# Patient Record
Sex: Female | Born: 1960
Health system: Southern US, Community
[De-identification: ages and names within clinical notes are randomized; demographics above are authoritative.]

## PROBLEM LIST (undated history)

## (undated) DIAGNOSIS — T7840XA Allergy, unspecified, initial encounter: Secondary | ICD-10-CM

## (undated) DIAGNOSIS — I10 Essential (primary) hypertension: Secondary | ICD-10-CM

## (undated) DIAGNOSIS — H269 Unspecified cataract: Secondary | ICD-10-CM

## (undated) DIAGNOSIS — E785 Hyperlipidemia, unspecified: Secondary | ICD-10-CM

## (undated) DIAGNOSIS — G43909 Migraine, unspecified, not intractable, without status migrainosus: Secondary | ICD-10-CM

## (undated) DIAGNOSIS — E119 Type 2 diabetes mellitus without complications: Secondary | ICD-10-CM

## (undated) HISTORY — PX: CHOLECYSTECTOMY: SHX55

## (undated) HISTORY — DX: Migraine, unspecified, not intractable, without status migrainosus: G43.909

## (undated) HISTORY — PX: TUBAL LIGATION: SHX77

## (undated) HISTORY — DX: Unspecified cataract: H26.9

## (undated) HISTORY — DX: Hyperlipidemia, unspecified: E78.5

## (undated) HISTORY — DX: Allergy, unspecified, initial encounter: T78.40XA

---

## 1999-06-24 ENCOUNTER — Other Ambulatory Visit: Admission: RE | Admit: 1999-06-24 | Discharge: 1999-06-24 | Payer: Self-pay | Admitting: Family Medicine

## 2000-01-16 ENCOUNTER — Other Ambulatory Visit: Admission: RE | Admit: 2000-01-16 | Discharge: 2000-01-16 | Payer: Self-pay | Admitting: Family Medicine

## 2000-10-29 ENCOUNTER — Other Ambulatory Visit: Admission: RE | Admit: 2000-10-29 | Discharge: 2000-10-29 | Payer: Self-pay | Admitting: Family Medicine

## 2003-01-06 ENCOUNTER — Other Ambulatory Visit: Admission: RE | Admit: 2003-01-06 | Discharge: 2003-01-06 | Payer: Self-pay | Admitting: Obstetrics & Gynecology

## 2003-11-28 ENCOUNTER — Ambulatory Visit (HOSPITAL_COMMUNITY): Admission: RE | Admit: 2003-11-28 | Discharge: 2003-11-28 | Payer: Self-pay | Admitting: Obstetrics & Gynecology

## 2004-01-10 ENCOUNTER — Other Ambulatory Visit: Admission: RE | Admit: 2004-01-10 | Discharge: 2004-01-10 | Payer: Self-pay | Admitting: Obstetrics & Gynecology

## 2004-10-28 ENCOUNTER — Emergency Department (HOSPITAL_COMMUNITY): Admission: EM | Admit: 2004-10-28 | Discharge: 2004-10-28 | Payer: Self-pay | Admitting: Emergency Medicine

## 2005-01-22 ENCOUNTER — Other Ambulatory Visit: Admission: RE | Admit: 2005-01-22 | Discharge: 2005-01-22 | Payer: Self-pay | Admitting: Obstetrics & Gynecology

## 2009-07-14 ENCOUNTER — Inpatient Hospital Stay (HOSPITAL_COMMUNITY): Admission: EM | Admit: 2009-07-14 | Discharge: 2009-07-18 | Payer: Self-pay | Admitting: Emergency Medicine

## 2009-07-15 ENCOUNTER — Encounter (INDEPENDENT_AMBULATORY_CARE_PROVIDER_SITE_OTHER): Payer: Self-pay | Admitting: Surgery

## 2010-12-26 LAB — CBC
HCT: 37.8 % (ref 36.0–46.0)
HCT: 41.6 % (ref 36.0–46.0)
Hemoglobin: 11.5 g/dL — ABNORMAL LOW (ref 12.0–15.0)
Hemoglobin: 13.2 g/dL (ref 12.0–15.0)
Hemoglobin: 14.3 g/dL (ref 12.0–15.0)
MCHC: 34.4 g/dL (ref 30.0–36.0)
MCHC: 34.4 g/dL (ref 30.0–36.0)
MCHC: 34.9 g/dL (ref 30.0–36.0)
Platelets: 281 10*3/uL (ref 150–400)
Platelets: 299 10*3/uL (ref 150–400)
Platelets: 332 10*3/uL (ref 150–400)
Platelets: 333 10*3/uL (ref 150–400)
RBC: 3.44 MIL/uL — ABNORMAL LOW (ref 3.87–5.11)
RBC: 3.92 MIL/uL (ref 3.87–5.11)
RDW: 12.6 % (ref 11.5–15.5)
RDW: 13 % (ref 11.5–15.5)
WBC: 13.8 10*3/uL — ABNORMAL HIGH (ref 4.0–10.5)
WBC: 9.2 10*3/uL (ref 4.0–10.5)

## 2010-12-26 LAB — COMPREHENSIVE METABOLIC PANEL
ALT: 238 U/L — ABNORMAL HIGH (ref 0–35)
AST: 175 U/L — ABNORMAL HIGH (ref 0–37)
AST: 21 U/L (ref 0–37)
Alkaline Phosphatase: 107 U/L (ref 39–117)
BUN: 5 mg/dL — ABNORMAL LOW (ref 6–23)
BUN: 5 mg/dL — ABNORMAL LOW (ref 6–23)
CO2: 27 mEq/L (ref 19–32)
CO2: 30 mEq/L (ref 19–32)
Calcium: 9.2 mg/dL (ref 8.4–10.5)
Creatinine, Ser: 0.67 mg/dL (ref 0.4–1.2)
GFR calc Af Amer: >60 mL/min (ref >60)
GFR calc non Af Amer: >60 mL/min (ref >60)
GFR calc non Af Amer: >60 mL/min (ref >60)
Glucose, Bld: 95 mg/dL (ref 70–99)
Glucose, Bld: 97 mg/dL (ref 70–99)
Potassium: 3.7 mEq/L (ref 3.5–5.1)
Total Bilirubin: 0.5 mg/dL (ref 0.3–1.2)
Total Bilirubin: 0.5 mg/dL (ref 0.3–1.2)
Total Protein: 6.6 g/dL (ref 6.0–8.3)

## 2010-12-26 LAB — URINALYSIS, ROUTINE W REFLEX MICROSCOPIC
Bilirubin Urine: NEGATIVE
Glucose, UA: NEGATIVE mg/dL
Hgb urine dipstick: NEGATIVE
Ketones, ur: NEGATIVE mg/dL

## 2010-12-26 LAB — DIFFERENTIAL
Eosinophils Absolute: 0.4 10*3/uL (ref 0.0–0.7)
Eosinophils Relative: 2 % (ref 0–5)
Lymphs Abs: 3 10*3/uL (ref 0.7–4.0)
Monocytes Absolute: 1 10*3/uL (ref 0.1–1.0)

## 2010-12-26 LAB — URINE MICROSCOPIC-ADD ON

## 2011-02-07 NOTE — Op Note (Signed)
NAMEJULYSSA, Latoya Wells                        ACCOUNT NO.:  [id number]   MEDICAL RECORD NO.:  [id number]                   PATIENT TYPE:  AMB   LOCATION:  SDC                                  FACILITY:  [facility]   PHYSICIAN:  Ilda Mori, M.D.                DATE OF BIRTH:  01-Dec-1960   DATE OF PROCEDURE:  11/28/2003  DATE OF DISCHARGE:                                 OPERATIVE REPORT   PREOPERATIVE DIAGNOSIS:  Right Bartholin gland cyst.   POSTOPERATIVE DIAGNOSIS:  Right Bartholin gland cyst.   PROCEDURE:  Marsupialization of right Bartholin gland cyst.   SURGEON:  Ilda Mori, M.D.   ANESTHESIA:  General.   ESTIMATED BLOOD LOSS:  10 mL.   FINDINGS:  There was a 4 cm noninflamed cyst at the Bartholin's duct.   INDICATIONS FOR PROCEDURE:  This is a 50 year old female who has had a known  Bartholin's duct for greater than three years.  The duct has caused no  symptoms, however, in the last several months it has grown in size and the  decision was made to marsupialize it.   DESCRIPTION OF PROCEDURE:  The patient was taken to the operating room and  general anesthesia was induced.  She was placed in the dorsal lithotomy  position  and the vulva was prepped and draped in a sterile fashion.  An  incision was made at the subcutaneous junction in the right labia majora  over the cyst.  The incision was approximately 3-4 cm in length.  The  incision was carried down to the duct which was then opened and the duct was  extended the length of the cutaneous incision.  A thick brownish discharge  from the cyst was drained and the cyst was irrigated.  The cyst wall was  then marsupialized out to the subcutaneous tissue using 3-0 Vicryl sutures.  Six sutures were placed, their location was 12 o'clock, 2 o'clock, 4  o'clock, 6 o'clock, 8 o'clock, and 10 o'clock.  At the end of the procedure,  hemostasis was noted to be present and the cyst had been completely drained.  The procedure was  then terminated and the patient left the operating room in  good condition.                                               Ilda Mori, M.D.    RK/MEDQ  D:  11/28/2003  T:  11/28/2003  Job:  [id number]

## 2011-05-22 ENCOUNTER — Other Ambulatory Visit: Payer: Self-pay | Admitting: Family Medicine

## 2011-05-22 ENCOUNTER — Ambulatory Visit
Admission: RE | Admit: 2011-05-22 | Discharge: 2011-05-22 | Disposition: A | Discharge status: Home / Self Care | Payer: BC Managed Care – PPO | Source: Ambulatory Visit | Attending: Family Medicine | Admitting: Family Medicine

## 2011-05-22 DIAGNOSIS — R609 Edema, unspecified: Secondary | ICD-10-CM

## 2011-05-22 DIAGNOSIS — R52 Pain, unspecified: Secondary | ICD-10-CM

## 2013-10-11 ENCOUNTER — Other Ambulatory Visit: Payer: Self-pay

## 2014-09-06 ENCOUNTER — Emergency Department (HOSPITAL_COMMUNITY): Payer: BC Managed Care – PPO

## 2014-09-06 ENCOUNTER — Encounter (HOSPITAL_COMMUNITY): Payer: Self-pay | Admitting: Emergency Medicine

## 2014-09-06 ENCOUNTER — Emergency Department (HOSPITAL_COMMUNITY)
Admission: EM | Admit: 2014-09-06 | Discharge: 2014-09-06 | Disposition: A | Discharge status: Home / Self Care | Payer: BC Managed Care – PPO | Attending: Emergency Medicine | Admitting: Emergency Medicine

## 2014-09-06 DIAGNOSIS — I10 Essential (primary) hypertension: Secondary | ICD-10-CM | POA: Diagnosis not present

## 2014-09-06 DIAGNOSIS — Z7982 Long term (current) use of aspirin: Secondary | ICD-10-CM | POA: Insufficient documentation

## 2014-09-06 DIAGNOSIS — R1031 Right lower quadrant pain: Secondary | ICD-10-CM

## 2014-09-06 DIAGNOSIS — Z720 Tobacco use: Secondary | ICD-10-CM | POA: Diagnosis not present

## 2014-09-06 DIAGNOSIS — Z9089 Acquired absence of other organs: Secondary | ICD-10-CM | POA: Diagnosis not present

## 2014-09-06 DIAGNOSIS — Z79899 Other long term (current) drug therapy: Secondary | ICD-10-CM | POA: Diagnosis not present

## 2014-09-06 DIAGNOSIS — E119 Type 2 diabetes mellitus without complications: Secondary | ICD-10-CM | POA: Insufficient documentation

## 2014-09-06 HISTORY — DX: Essential (primary) hypertension: I10

## 2014-09-06 HISTORY — DX: Type 2 diabetes mellitus without complications: E11.9

## 2014-09-06 LAB — URINE MICROSCOPIC-ADD ON

## 2014-09-06 LAB — COMPREHENSIVE METABOLIC PANEL
ALT: 15 U/L (ref 0–35)
AST: 16 U/L (ref 0–37)
Albumin: 3.7 g/dL (ref 3.5–5.2)
Alkaline Phosphatase: 90 U/L (ref 39–117)
Anion gap: 13 (ref 5–15)
BILIRUBIN TOTAL: 0.3 mg/dL (ref 0.3–1.2)
BUN: 19 mg/dL (ref 6–23)
CALCIUM: 9.3 mg/dL (ref 8.4–10.5)
CHLORIDE: 101 meq/L (ref 96–112)
CO2: 24 mEq/L (ref 19–32)
CREATININE: 0.71 mg/dL (ref 0.50–1.10)
Glucose, Bld: 97 mg/dL (ref 70–99)
POTASSIUM: 3.9 meq/L (ref 3.7–5.3)
SODIUM: 138 meq/L (ref 137–147)
Total Protein: 7.7 g/dL (ref 6.0–8.3)

## 2014-09-06 LAB — URINALYSIS, ROUTINE W REFLEX MICROSCOPIC
Bilirubin Urine: NEGATIVE
GLUCOSE, UA: NEGATIVE mg/dL
Hgb urine dipstick: NEGATIVE
Ketones, ur: NEGATIVE mg/dL
NITRITE: NEGATIVE
PH: 5.5 (ref 5.0–8.0)
Protein, ur: NEGATIVE mg/dL
SPECIFIC GRAVITY, URINE: 1.024 (ref 1.005–1.030)
Urobilinogen, UA: 1 mg/dL (ref 0.0–1.0)

## 2014-09-06 LAB — CBC WITH DIFFERENTIAL/PLATELET
Basophils Absolute: 0.1 10*3/uL (ref 0.0–0.1)
Basophils Relative: 0 % (ref 0–1)
EOS PCT: 4 % (ref 0–5)
Eosinophils Absolute: 0.5 10*3/uL (ref 0.0–0.7)
HEMATOCRIT: 44.9 % (ref 36.0–46.0)
Hemoglobin: 14.6 g/dL (ref 12.0–15.0)
LYMPHS PCT: 32 % (ref 12–46)
Lymphs Abs: 4.4 10*3/uL — ABNORMAL HIGH (ref 0.7–4.0)
MCH: 31.5 pg (ref 26.0–34.0)
MCHC: 32.5 g/dL (ref 30.0–36.0)
MCV: 97 fL (ref 78.0–100.0)
Monocytes Absolute: 0.9 10*3/uL (ref 0.1–1.0)
Monocytes Relative: 6 % (ref 3–12)
NEUTROS PCT: 58 % (ref 43–77)
Neutro Abs: 8.1 10*3/uL — ABNORMAL HIGH (ref 1.7–7.7)
PLATELETS: 282 10*3/uL (ref 150–400)
RBC: 4.63 MIL/uL (ref 3.87–5.11)
RDW: 13.3 % (ref 11.5–15.5)
WBC: 13.9 10*3/uL — AB (ref 4.0–10.5)

## 2014-09-06 LAB — CK: CK TOTAL: 82 U/L (ref 7–177)

## 2014-09-06 MED ORDER — HYDROCODONE-ACETAMINOPHEN 5-325 MG PO TABS: 2.0000 | ORAL_TABLET | ORAL | Status: DC | PRN

## 2014-09-06 MED ORDER — DICYCLOMINE HCL 20 MG PO TABS: 20.0000 mg | ORAL_TABLET | Freq: Two times a day (BID) | ORAL | Status: DC

## 2014-09-06 MED ORDER — DIPHENOXYLATE-ATROPINE 2.5-0.025 MG PO TABS: 2.0000 | ORAL_TABLET | Freq: Four times a day (QID) | ORAL | Status: DC | PRN

## 2014-09-06 MED ORDER — ONDANSETRON HCL 4 MG/2ML IJ SOLN
4.0000 mg | Freq: Once | INTRAMUSCULAR | Status: AC
  Administered 2014-09-06: 4 mg via INTRAVENOUS
  Filled 2014-09-06: qty 2

## 2014-09-06 MED ORDER — IOHEXOL 300 MG/ML  SOLN
100.0000 mL | Freq: Once | INTRAMUSCULAR | Status: AC | PRN
  Administered 2014-09-06: 100 mL via INTRAVENOUS

## 2014-09-06 MED ORDER — FENTANYL CITRATE 0.05 MG/ML IJ SOLN
50.0000 ug | Freq: Once | INTRAMUSCULAR | Status: AC
  Administered 2014-09-06: 50 ug via INTRAVENOUS
  Filled 2014-09-06: qty 2

## 2014-09-06 MED ORDER — ONDANSETRON HCL 4 MG PO TABS: 4.0000 mg | ORAL_TABLET | Freq: Four times a day (QID) | ORAL | Status: DC

## 2014-09-06 MED ORDER — SODIUM CHLORIDE 0.9 % IV SOLN
Freq: Once | INTRAVENOUS | Status: AC
  Administered 2014-09-06: 19:00:00 via INTRAVENOUS

## 2014-09-06 NOTE — Discharge Instructions (Signed)

## 2014-09-06 NOTE — ED Notes (Signed)
Pt alert, arrives from Dr office, sent for evaluation of abd pain, onset was this am, describes pain as dull, epigastric pain, denies N/V, denies changes in bowel or bladder, resp even unlabored, skin pwd

## 2014-09-06 NOTE — ED Provider Notes (Signed)
CSN: [id number]     Arrival date & time 09/06/14  1630 History   First MD Initiated Contact with Patient 09/06/14 1805     Chief Complaint  Patient presents with  . Abdominal Pain     (Consider location/radiation/quality/duration/timing/severity/associated sxs/prior Treatment) HPI Comments: Patient sent to the emergency department for evaluation of abdominal pain. Patient reports that she first noticed pain at around 5 this morning. Reports no diffuse pain through the course of the day. No nausea, vomiting, diarrhea or fever. Pain worsened through the course of the day and she saw her doctor this evening. He became concerned about tenderness in the right lower quadrant and centered to the ER for further evaluation.  Patient also reports that she did not feel well last night. She started to notice aching pain in her arms followed by her legs and then broke out in a rash on her arms. Her doctor told her it might be the lisinopril that she takes.  Patient is a 53 y.o. female presenting with abdominal pain.  Abdominal Pain   Past Medical History  Diagnosis Date  . Diabetes mellitus without complication   . Hypertension    Past Surgical History  Procedure Laterality Date  . Cholecystectomy     No family history on file. History  Substance Use Topics  . Smoking status: Current Every Day Smoker    Types: Cigarettes  . Smokeless tobacco: Not on file  . Alcohol Use: No   OB History    No data available     Review of Systems  Gastrointestinal: Positive for abdominal pain.  All other systems reviewed and are negative.     Allergies  Hydromorphone  Home Medications   Prior to Admission medications   Medication Sig Start Date End Date Taking? Authorizing Provider  ALPRAZolam Duanne Moron) 0.5 MG tablet Take 0.25-0.5 mg by mouth at bedtime as needed for anxiety or sleep (sleep).   Yes Historical Provider, MD  aspirin 81 MG tablet Take 81 mg by mouth daily.   Yes Historical  Provider, MD  hydrochlorothiazide (HYDRODIURIL) 25 MG tablet Take 25 mg by mouth daily.   Yes Historical Provider, MD  lisinopril (PRINIVIL,ZESTRIL) 10 MG tablet Take 10 mg by mouth daily.   Yes Historical Provider, MD  metFORMIN (GLUCOPHAGE-XR) 500 MG 24 hr tablet Take 500 mg by mouth daily with breakfast.   Yes Historical Provider, MD  pravastatin (PRAVACHOL) 20 MG tablet Take 20 mg by mouth daily.   Yes Historical Provider, MD   BP 126/75 mmHg  Pulse 94  Temp(Src) 97.7 F (36.5 C) (Oral)  Resp 18  Wt 188 lb (85.276 kg)  SpO2 97% Physical Exam  Constitutional: She is oriented to person, place, and time. She appears well-developed and well-nourished. No distress.  HENT:  Head: Normocephalic and atraumatic.  Right Ear: Hearing normal.  Left Ear: Hearing normal.  Nose: Nose normal.  Mouth/Throat: Oropharynx is clear and moist and mucous membranes are normal.  Eyes: Conjunctivae and EOM are normal. Pupils are equal, round, and reactive to light.  Neck: Normal range of motion. Neck supple.  Cardiovascular: Regular rhythm, S1 normal and S2 normal.  Exam reveals no gallop and no friction rub.   No murmur heard. Pulmonary/Chest: Effort normal and breath sounds normal. No respiratory distress. She exhibits no tenderness.  Abdominal: Soft. Normal appearance and bowel sounds are normal. There is no hepatosplenomegaly. There is tenderness in the right lower quadrant. There is guarding. There is no rebound, no tenderness at  McBurney's point and negative Murphy's sign. No hernia.  Musculoskeletal: Normal range of motion.  Neurological: She is alert and oriented to person, place, and time. She has normal strength. No cranial nerve deficit or sensory deficit. Coordination normal. GCS eye subscore is 4. GCS verbal subscore is 5. GCS motor subscore is 6.  Skin: Skin is warm, dry and intact. No rash noted. No cyanosis.  Psychiatric: She has a normal mood and affect. Her speech is normal and behavior is  normal. Thought content normal.  Nursing note and vitals reviewed.   ED Course  Procedures (including critical care time) Labs Review Labs Reviewed  CBC WITH DIFFERENTIAL - Abnormal; Notable for the following:    WBC 13.9 (*)    Neutro Abs 8.1 (*)    Lymphs Abs 4.4 (*)    All other components within normal limits  URINALYSIS, ROUTINE W REFLEX MICROSCOPIC - Abnormal; Notable for the following:    Leukocytes, UA SMALL (*)    All other components within normal limits  COMPREHENSIVE METABOLIC PANEL  URINE MICROSCOPIC-ADD ON  CK  I-STAT CG4 LACTIC ACID, ED    Imaging Review No results found.   EKG Interpretation None      MDM   Final diagnoses:  RLQ abdominal pain  enteritis  She presented to the ER for evaluation of abdominal pain. She was referred by her primary care doctor to be evaluated for possibility of appendicitis. Patient reports diffuse abdominal pain through the course of the day, but she seemed to be mostly tender in the right lower quadrant upon arrival to the ER. Patient underwentET abdomen and pelvis with contrast. Appendix was identified and is normal. Findings are consistent with enteritis. Patient will be treated symptomatically, follow-up with primary doctor. She was told to return if her symptoms worsen.   Orpah Greek, MD 09/06/14 2052

## 2014-09-07 LAB — I-STAT CG4 LACTIC ACID, ED: LACTIC ACID, VENOUS: 1.64 mmol/L (ref 0.5–2.2)

## 2015-01-15 IMAGING — CT CT ABD-PELV W/ CM
1 of 3 series · 13 of 32 positions shown, 18 images · IV contrast (OMNIPAQUE 300)
Comparison: 07/14/2009

CLINICAL DATA: Acute onset of right lower quadrant pain

EXAM:
CT ABDOMEN AND PELVIS WITH CONTRAST
TECHNIQUE: Multidetector CT imaging of the abdomen and pelvis was performed
using the standard protocol following bolus administration of
intravenous contrast.
CONTRAST:  100mL OMNIPAQUE IOHEXOL 300 MG/ML  SOLN

[Series 2: abd/pel with · axial · 0.71mm/px · z∈[+910,+1335]mm · 13 of 96 slices shown, 18 images]
[im 6/96  soft-tissue]
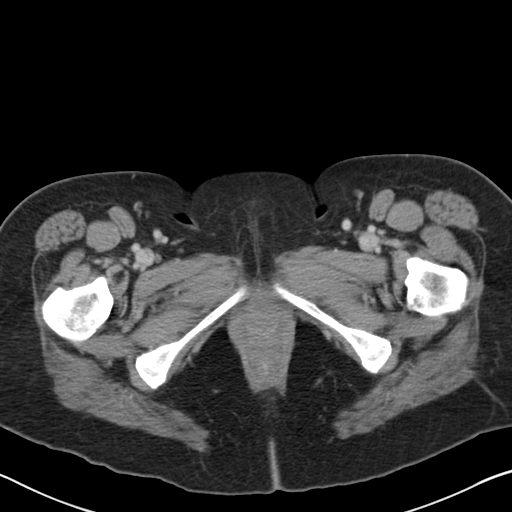
[im 6/96  bone]
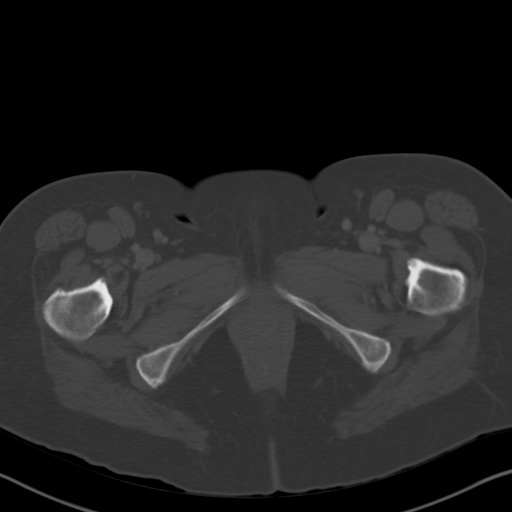
[im 16/96  soft-tissue]
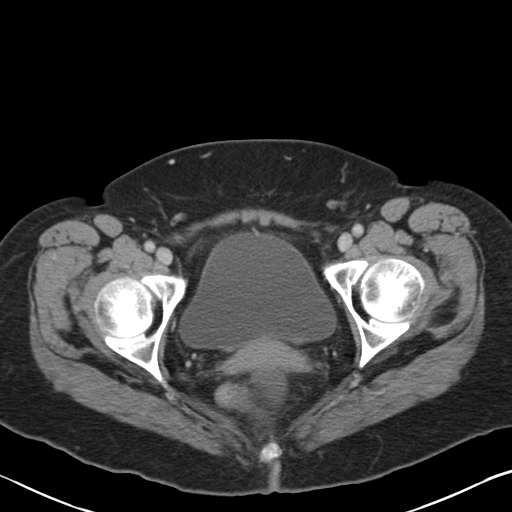
[im 21/96  soft-tissue]
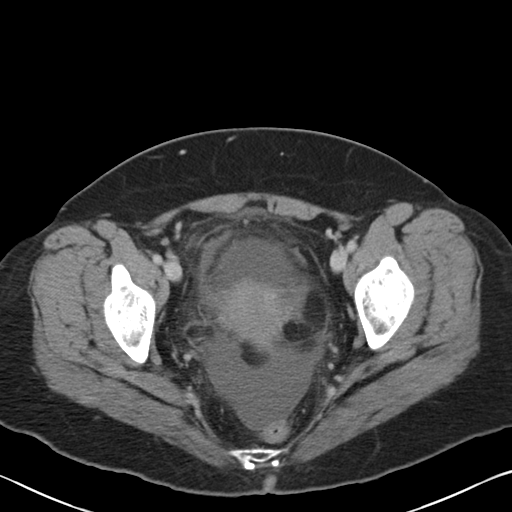
[im 31/96  soft-tissue]
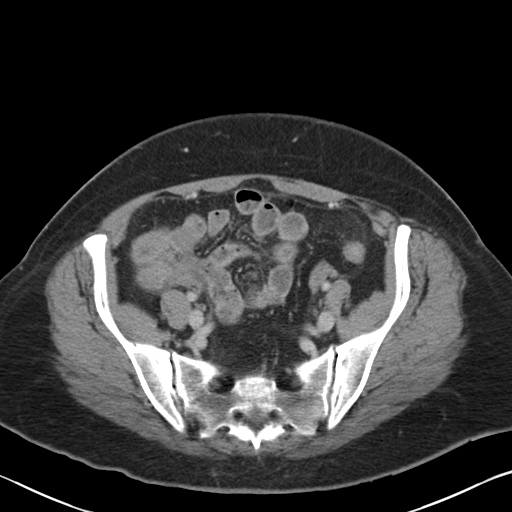
[im 36/96  soft-tissue]
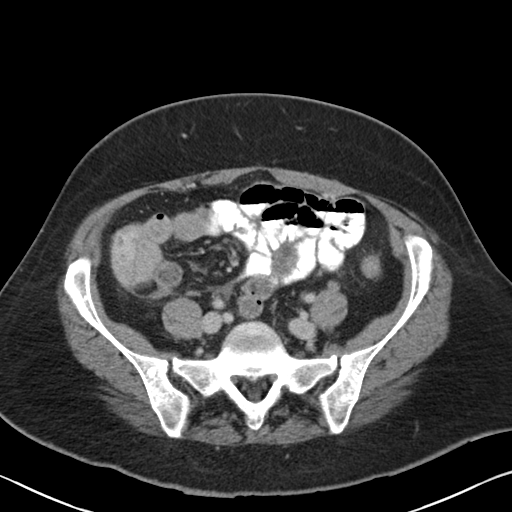
[im 46/96  soft-tissue]
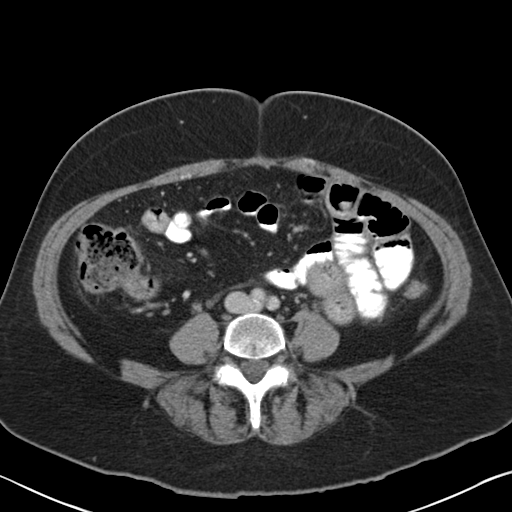
[im 51/96  soft-tissue]
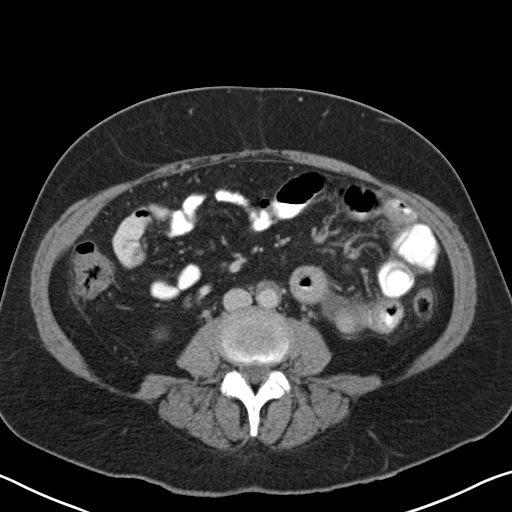
[im 61/96  soft-tissue]
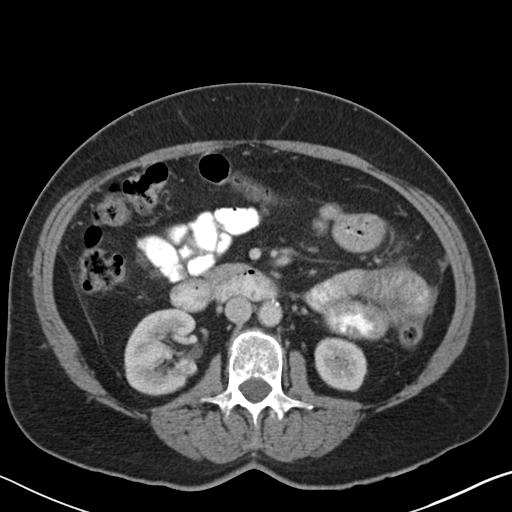
[im 66/96  soft-tissue]
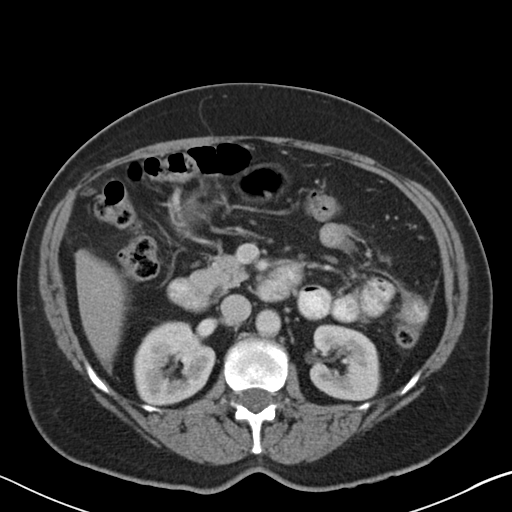
[im 66/96  bone]
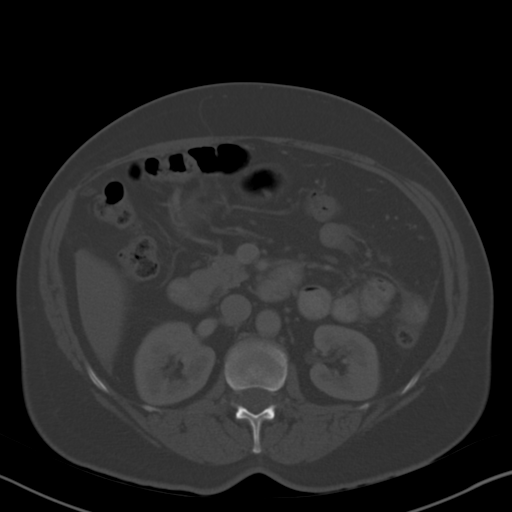
[im 76/96  soft-tissue]
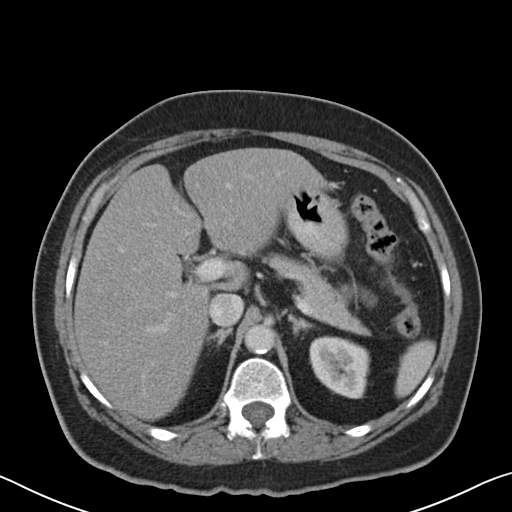
[im 76/96  lung]
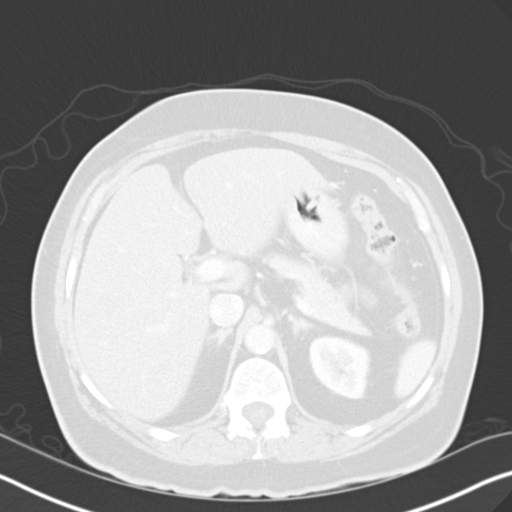
[im 81/96  soft-tissue]
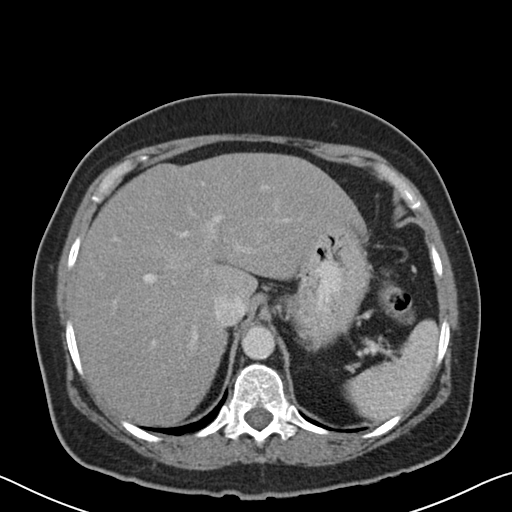
[im 81/96  lung]
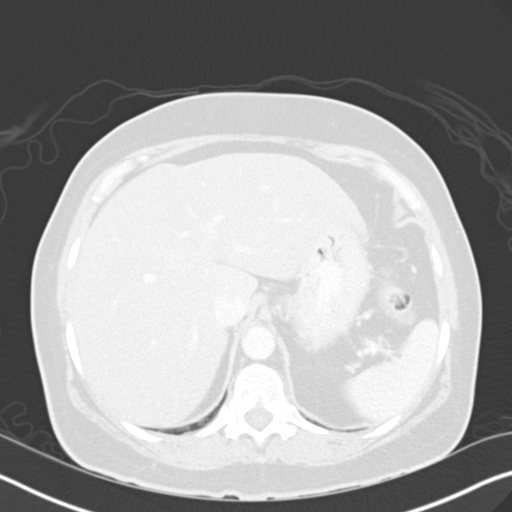
[im 86/96  lung]
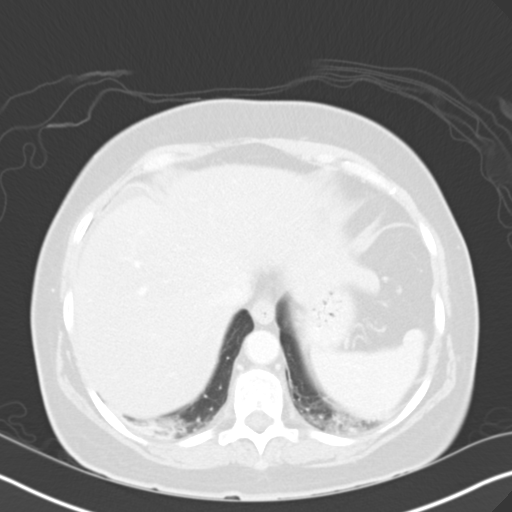
[im 91/96  soft-tissue]
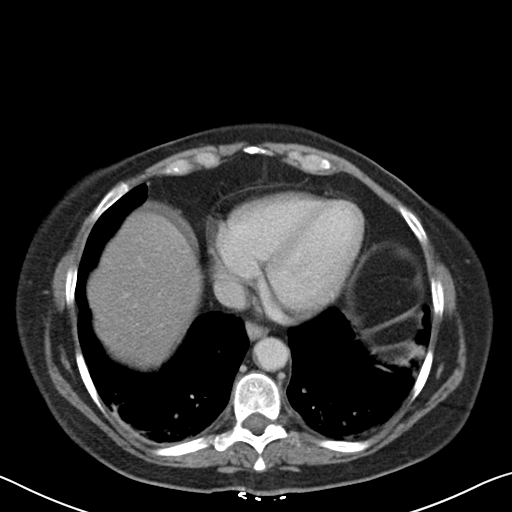
[im 91/96  lung]
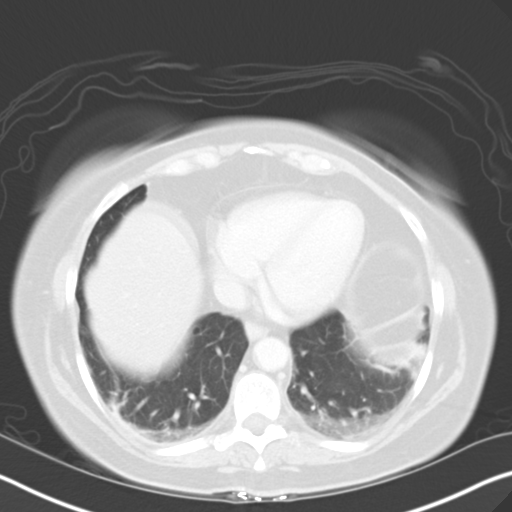

[13 of 32 positions shown; findings below may reference images not displayed]

FINDINGS: Sagittal images of the spine are unremarkable. Lung bases are
unremarkable. Tiny hiatal hernia.

There is mild hepatic fatty infiltration. The patient is status
postcholecystectomy. No intrahepatic biliary ductal dilatation. CBD
measures 8.5 mm in diameter.

Pancreas, spleen and adrenal glands are unremarkable. Kidneys are
symmetrical in size and enhancement. No hydronephrosis or
hydroureter.

Delayed renal images shows bilateral renal symmetrical excretion.
Bilateral visualized proximal ureter is unremarkable.
Atherosclerotic calcifications of abdominal aorta and iliac arteries
are noted. No aortic aneurysm.

There is no pericecal inflammation. Normal appendix clearly
visualize in axial image 59. The terminal ileum is unremarkable.
Small fecal like material noted in terminal ileum probable
incompetent ileocecal valve.

In axial image 42 and 39 there is mild thickening of the wall of
small bowel loops in jejunum in left abdomen. There is small amount
of fluid adjacent to the small bowel within mesentery. Best seen in
axial image 29. This is confirmed on coronal image 62. Findings are
consistent with segmental enteritis. There is no evidence of
extraluminal contrast material. No mesenteric abscess.

Thickening of small bowel wall also noted left abdomen axial image
45.

Tiny umbilical hernia containing fat without evidence of acute
complication.

Some stool are noted in the cecum. There is no evidence of small
bowel obstruction. No free abdominal air.

There is small to moderate pelvic ascites in posterior cul-de-sac.
There is probable fibroid within uterus axial image 73 measures 1
cm. No adnexal masses noted. The urinary bladder is unremarkable.
Sigmoid colon diverticula are noted. No evidence of acute
diverticulitis. The descending colon sigmoid colon and rectum are
empty collapsed. Small nonspecific bilateral inguinal lymph nodes.
IMPRESSION: 1. Mild hepatic fatty infiltration.  Status postcholecystectomy.
2. Normal appendix.  No pericecal inflammation.
3. Abnormal mild thickening of small bowel wall in left abdomen in
jejunal loops with small amount of adjacent fluid. Findings are
consistent with segmental enteritis. No evidence of small bowel
perforation. No mesenteric abscess. No small bowel obstruction.
4. Small to moderate free fluid noted within posterior pelvis.
Sigmoid colon diverticula are noted. No evidence of acute
diverticulitis.
5. Question small uterine fibroid within uterus measures 1 cm.

## 2015-04-09 ENCOUNTER — Telehealth: Payer: Self-pay | Admitting: Internal Medicine

## 2015-04-09 NOTE — Telephone Encounter (Signed)
new patient appt-s/w patient and gave np appt for 08/18 @ 11 w/Dr. Julien Nordmann Referring Dr. Darcus Austin Dx- Leukocytosis   Information scanned

## 2015-04-09 NOTE — Telephone Encounter (Signed)
Left message for patient and gave new d/t for appt  08/18 @ 11 w/Dr. Julien Nordmann.

## 2015-05-08 ENCOUNTER — Ambulatory Visit: Payer: Self-pay | Admitting: Internal Medicine

## 2015-05-08 ENCOUNTER — Other Ambulatory Visit: Payer: Self-pay

## 2015-05-08 ENCOUNTER — Ambulatory Visit: Payer: Self-pay

## 2015-05-10 ENCOUNTER — Ambulatory Visit: Payer: Self-pay

## 2015-05-10 ENCOUNTER — Ambulatory Visit (HOSPITAL_BASED_OUTPATIENT_CLINIC_OR_DEPARTMENT_OTHER): Payer: 59 | Admitting: Internal Medicine

## 2015-05-10 ENCOUNTER — Telehealth: Payer: Self-pay | Admitting: Internal Medicine

## 2015-05-10 ENCOUNTER — Encounter: Payer: Self-pay | Admitting: Internal Medicine

## 2015-05-10 ENCOUNTER — Other Ambulatory Visit (HOSPITAL_COMMUNITY)
Admission: RE | Admit: 2015-05-10 | Discharge: 2015-05-10 | Disposition: A | Discharge status: Home / Self Care | Payer: 59 | Source: Ambulatory Visit | Attending: Internal Medicine | Admitting: Internal Medicine

## 2015-05-10 ENCOUNTER — Other Ambulatory Visit (HOSPITAL_BASED_OUTPATIENT_CLINIC_OR_DEPARTMENT_OTHER): Payer: 59

## 2015-05-10 ENCOUNTER — Other Ambulatory Visit: Payer: Self-pay | Admitting: Medical Oncology

## 2015-05-10 ENCOUNTER — Ambulatory Visit: Payer: 59

## 2015-05-10 VITALS — BP 136/66 | HR 85 | Temp 98.3°F | Resp 18 | Ht 65.5 in | Wt 182.2 lb

## 2015-05-10 DIAGNOSIS — E119 Type 2 diabetes mellitus without complications: Secondary | ICD-10-CM

## 2015-05-10 DIAGNOSIS — D72829 Elevated white blood cell count, unspecified: Secondary | ICD-10-CM

## 2015-05-10 DIAGNOSIS — I10 Essential (primary) hypertension: Secondary | ICD-10-CM

## 2015-05-10 DIAGNOSIS — E785 Hyperlipidemia, unspecified: Secondary | ICD-10-CM

## 2015-05-10 DIAGNOSIS — D72820 Lymphocytosis (symptomatic): Secondary | ICD-10-CM | POA: Diagnosis present

## 2015-05-10 LAB — CBC WITH DIFFERENTIAL/PLATELET
BASO%: 0.7 % (ref 0.0–2.0)
BASOS ABS: 0.1 10*3/uL (ref 0.0–0.1)
EOS%: 2.8 % (ref 0.0–7.0)
Eosinophils Absolute: 0.3 10*3/uL (ref 0.0–0.5)
HCT: 43.1 % (ref 34.8–46.6)
HGB: 14.8 g/dL (ref 11.6–15.9)
LYMPH#: 4.7 10*3/uL — AB (ref 0.9–3.3)
LYMPH%: 37.3 % (ref 14.0–49.7)
MCH: 32.3 pg (ref 25.1–34.0)
MCHC: 34.4 g/dL (ref 31.5–36.0)
MCV: 94.1 fL (ref 79.5–101.0)
MONO#: 0.9 10*3/uL (ref 0.1–0.9)
MONO%: 6.9 % (ref 0.0–14.0)
NEUT#: 6.6 10*3/uL — ABNORMAL HIGH (ref 1.5–6.5)
NEUT%: 52.3 % (ref 38.4–76.8)
Platelets: 283 10*3/uL (ref 145–400)
RBC: 4.59 10*6/uL (ref 3.70–5.45)
RDW: 12.8 % (ref 11.2–14.5)
WBC: 12.6 10*3/uL — ABNORMAL HIGH (ref 3.9–10.3)

## 2015-05-10 LAB — COMPREHENSIVE METABOLIC PANEL (CC13)
ALT: 20 U/L (ref 0–55)
AST: 13 U/L (ref 5–34)
Albumin: 3.7 g/dL (ref 3.5–5.0)
Alkaline Phosphatase: 93 U/L (ref 40–150)
Anion Gap: 11 mEq/L (ref 3–11)
BUN: 15.6 mg/dL (ref 7.0–26.0)
CO2: 22 meq/L (ref 22–29)
Calcium: 9.5 mg/dL (ref 8.4–10.4)
Chloride: 108 mEq/L (ref 98–109)
Creatinine: 0.7 mg/dL (ref 0.6–1.1)
EGFR: >90 mL/min/{1.73_m2} (ref >90)
GLUCOSE: 108 mg/dL (ref 70–140)
POTASSIUM: 3.6 meq/L (ref 3.5–5.1)
SODIUM: 141 meq/L (ref 136–145)
Total Bilirubin: 0.28 mg/dL (ref 0.20–1.20)
Total Protein: 7.5 g/dL (ref 6.4–8.3)

## 2015-05-10 NOTE — Progress Notes (Signed)
[place] [phone]   Fax:([phone]) [phone]  CONSULT NOTE  REFERRING PHYSICIAN: Dr. Darcus Austin  REASON FOR CONSULTATION:  54 years old white female with leukocytosis  HPI Latoya Wells is a 54 y.o. female was past medical history significant for hypertension, diabetes mellitus and dyslipidemia as well as long history of fluctuating leukocytosis. The patient was seen recently by her primary care physician Dr. Inda Merlin for routine evaluation and management of her hypertension and diabetes mellitus. CBC performed on 03/28/2015 showed elevated white blood count of 13.9 with absolute neutrophil count of 8700 and absolute lymphocyte count of 3900. The patient was referred to me today for evaluation and recommendation regarding her leukocytosis. The patient denied having any intercurrent infection specifically no upper respiratory infection or urinary tract infection. She has chronic sinus problem and she is also a current smoker. She is not on any steroid treatment or any other hormonal therapy. She was treated for teeth abscesses few years ago. Reviewing her prior echo [facility] visit the patient has elevated white blood count of 13.8 on 07/16/2009. On 09/06/2014 her white blood count was also 13.9 with absolute neutrophil count of 8100 and absolute lymphocyte count of 4400. When seen today the patient is feeling fine with no specific complaints except for pain in the left hip area secondary to arthritis. She denied having any significant weight loss or night sweats. She has no chest pain, shortness of breath but has cough in the morning with no hemoptysis. The patient denied having any nausea or vomiting and no change in her bowel movement. Family history significant for a mother with heart disease and uterine cancer, as well as questionable myeloproliferative disorder. Father had hypertension, diabetes mellitus and heart disease. The patient is single and has one son. 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 She has a history of smoking 1 pack per day for 36 years and unfortunately she continues to smoke. She also has a history of alcohol abuse in the past and no history of drug abuse. HPI  Past Medical History  Diagnosis Date  . Diabetes mellitus without complication   . Hypertension     Past Surgical History  Procedure Laterality Date  . Cholecystectomy      History reviewed. No pertinent family history.  Social History Social History  Substance Use Topics  . Smoking status: Current Every Day Smoker -- 1.00 packs/day for 15 years    Types: Cigarettes  . Smokeless tobacco: None  . Alcohol Use: No    Allergies  Allergen Reactions  . Lisinopril Rash  . Hydromorphone Rash    Current Outpatient Prescriptions  Medication Sig Dispense Refill  . aspirin 81 MG tablet Take 81 mg by mouth daily.    . hydrochlorothiazide (HYDRODIURIL) 25 MG tablet Take 25 mg by mouth daily.    . metFORMIN (GLUCOPHAGE-XR) 500 MG 24 hr tablet Take 500 mg by mouth daily with breakfast.    . pravastatin (PRAVACHOL) 20 MG tablet Take 20 mg by mouth daily.     No current facility-administered medications for this visit.    Review of Systems  Constitutional: negative Eyes: negative Ears, nose, mouth, throat, and face: negative Respiratory: positive for cough Cardiovascular: negative Gastrointestinal: negative Genitourinary:negative Integument/breast: negative Hematologic/lymphatic: negative Musculoskeletal:positive for arthralgias Neurological: negative Behavioral/Psych: negative Endocrine: negative Allergic/Immunologic: negative  Physical Exam  HFW:YOVZC, healthy, no distress, well nourished and well developed SKIN: skin color, texture, turgor are normal, no rashes or significant lesions  HEAD: Normocephalic, No masses, lesions, tenderness or abnormalities EYES: normal, PERRLA, Conjunctiva are pink and non-injected EARS: External ears normal,  Canals clear OROPHARYNX:no exudate, no erythema and lips, buccal mucosa, and tongue normal  NECK: supple, no adenopathy, no JVD LYMPH:  no palpable lymphadenopathy, no hepatosplenomegaly BREAST:not examined LUNGS: clear to auscultation , and palpation HEART: regular rate & rhythm, no murmurs and no gallops ABDOMEN:abdomen soft, non-tender, obese, normal bowel sounds and no masses or organomegaly BACK: Back symmetric, no curvature., No CVA tenderness EXTREMITIES:no joint deformities, effusion, or inflammation, no edema, no skin discoloration  NEURO: alert & oriented x 3 with fluent speech, no focal motor/sensory deficits  PERFORMANCE STATUS: ECOG 1  LABORATORY DATA: Lab Results  Component Value Date   WBC 12.6* 05/10/2015   HGB 14.8 05/10/2015   HCT 43.1 05/10/2015   MCV 94.1 05/10/2015   PLT 283 05/10/2015      Chemistry      Component Value Date/Time   NA 141 05/10/2015 1053   NA 138 09/06/2014 1729   K 3.6 05/10/2015 1053   K 3.9 09/06/2014 1729   CL 101 09/06/2014 1729   CO2 22 05/10/2015 1053   CO2 24 09/06/2014 1729   BUN 15.6 05/10/2015 1053   BUN 19 09/06/2014 1729   CREATININE 0.7 05/10/2015 1053   CREATININE 0.71 09/06/2014 1729      Component Value Date/Time   CALCIUM 9.5 05/10/2015 1053   CALCIUM 9.3 09/06/2014 1729   ALKPHOS 93 05/10/2015 1053   ALKPHOS 90 09/06/2014 1729   AST 13 05/10/2015 1053   AST 16 09/06/2014 1729   ALT 20 05/10/2015 1053   ALT 15 09/06/2014 1729   BILITOT 0.28 05/10/2015 1053   BILITOT 0.3 09/06/2014 1729       RADIOGRAPHIC STUDIES: No results found.  ASSESSMENT: This is a very pleasant 54 years old white female presented for evaluation of persistent leukocytosis that has been going on for years and most likely reactive in nature secondary to chronic sinus problem in addition to smoking history. Myeloproliferative disorder cannot be ruled out at this point.   PLAN: I had a lengthy discussion with the patient and her  mother today about her current condition. I ordered several studies to rule out myeloproliferative disorder including repeat CBC, comprehensive metabolic panel, LDH as well as molecular study for BCR/ABL. I strongly recommended for the patient to quit smoking and also I advised her to discuss with her primary care physician treatment options for the persistent sinus infection. I will see the patient back for follow-up visit in one month for reevaluation and repeat CBC. If there is any concerning finding, I may consider the patient for a bone marrow biopsy and aspirate to rule out any bone marrow abnormality. She was advised to call immediately if she has any concerning symptoms in the interval. The patient voices understanding of current disease status and treatment options and is in agreement with the current care plan.  All questions were answered. The patient knows to call the clinic with any problems, questions or concerns. We can certainly see the patient much sooner if necessary.  Thank you so much for allowing me to participate in the care of [name]. I will continue to follow up the patient with you and assist in her care.  I spent 40 minutes counseling the patient face to face. The total time spent in the appointment was 55 minutes.  Disclaimer: This note was dictated with voice recognition software. Similar  sounding words can inadvertently be transcribed and may not be corrected upon review.   MOHAMED,MOHAMED K. May 10, 2015, 12:16 PM

## 2015-05-10 NOTE — Telephone Encounter (Signed)
Gave and printed appt sched and avs for pt for Sept °

## 2015-05-10 NOTE — Telephone Encounter (Signed)
Gave adn printed appt sched and avs for pt for Sept °

## 2015-05-10 NOTE — Progress Notes (Signed)
Checked in pt for blood concern. Pt made copay of $40 via cash. Receipt given. Pt may not need my services at this time.

## 2015-05-10 NOTE — Patient Instructions (Signed)
Smoking Cessation Quitting smoking is important to your health and has many advantages. However, it is not always easy to quit since nicotine is a very addictive drug. Oftentimes, people try 3 times or more before being able to quit. This document explains the best ways for you to prepare to quit smoking. Quitting takes hard work and a lot of effort, but you can do it. ADVANTAGES OF QUITTING SMOKING  You will live longer, feel better, and live better.  Your body will feel the impact of quitting smoking almost immediately.  Within 20 minutes, blood pressure decreases. Your pulse returns to its normal level.  After 8 hours, carbon monoxide levels in the blood return to normal. Your oxygen level increases.  After 24 hours, the chance of having a heart attack starts to decrease. Your breath, hair, and body stop smelling like smoke.  After 48 hours, damaged nerve endings begin to recover. Your sense of taste and smell improve.  After 72 hours, the body is virtually free of nicotine. Your bronchial tubes relax and breathing becomes easier.  After 2 to 12 weeks, lungs can hold more air. Exercise becomes easier and circulation improves.  The risk of having a heart attack, stroke, cancer, or lung disease is greatly reduced.  After 1 year, the risk of coronary heart disease is cut in half.  After 5 years, the risk of stroke falls to the same as a nonsmoker.  After 10 years, the risk of lung cancer is cut in half and the risk of other cancers decreases significantly.  After 15 years, the risk of coronary heart disease drops, usually to the level of a nonsmoker.  If you are pregnant, quitting smoking will improve your chances of having a healthy baby.  The people you live with, especially any children, will be healthier.  You will have extra money to spend on things other than cigarettes. QUESTIONS TO THINK ABOUT BEFORE ATTEMPTING TO QUIT You may want to talk about your answers with your  health care provider.  Why do you want to quit?  If you tried to quit in the past, what helped and what did not?  What will be the most difficult situations for you after you quit? How will you plan to handle them?  Who can help you through the tough times? Your family? Friends? A health care provider?  What pleasures do you get from smoking? What ways can you still get pleasure if you quit? Here are some questions to ask your health care provider:  How can you help me to be successful at quitting?  What medicine do you think would be best for me and how should I take it?  What should I do if I need more help?  What is smoking withdrawal like? How can I get information on withdrawal? GET READY  Set a quit date.  Change your environment by getting rid of all cigarettes, ashtrays, matches, and lighters in your home, car, or work. Do not let people smoke in your home.  Review your past attempts to quit. Think about what worked and what did not. GET SUPPORT AND ENCOURAGEMENT You have a better chance of being successful if you have help. You can get support in many ways.  Tell your family, friends, and coworkers that you are going to quit and need their support. Ask them not to smoke around you.  Get individual, group, or telephone counseling and support. Programs are available at local hospitals and health centers. Call   your local health department for information about programs in your area.  Spiritual beliefs and practices may help some smokers quit.  Download a "quit meter" on your computer to keep track of quit statistics, such as how long you have gone without smoking, cigarettes not smoked, and money saved.  Get a self-help book about quitting smoking and staying off tobacco. [place] yourself from urges to smoke. Talk to someone, go for a walk, or occupy your time with a task.  Change your normal routine. Take a different route to work.  Drink tea instead of coffee. Eat breakfast in a different place.  Reduce your stress. Take a hot bath, exercise, or read a book.  Plan something enjoyable to do every day. Reward yourself for not smoking.  Explore interactive web-based programs that specialize in helping you quit. GET MEDICINE AND USE IT CORRECTLY Medicines can help you stop smoking and decrease the urge to smoke. Combining medicine with the above behavioral methods and support can greatly increase your chances of successfully quitting smoking.  Nicotine replacement therapy helps deliver nicotine to your body without the negative effects and risks of smoking. Nicotine replacement therapy includes nicotine gum, lozenges, inhalers, nasal sprays, and skin patches. Some may be available over-the-counter and others require a prescription.  Antidepressant medicine helps people abstain from smoking, but how this works is unknown. This medicine is available by prescription.  Nicotinic receptor partial agonist medicine simulates the effect of nicotine in your brain. This medicine is available by prescription. Ask your health care provider for advice about which medicines to use and how to use them based on your health history. Your health care provider will tell you what side effects to look out for if you choose to be on a medicine or therapy. Carefully read the information on the package. Do not use any other product containing nicotine while using a nicotine replacement product.  RELAPSE OR DIFFICULT SITUATIONS Most relapses occur within the first 3 months after quitting. Do not be discouraged if you start smoking again. Remember, most people try several times before finally quitting. You may have symptoms of withdrawal because your body is used to nicotine. You may crave cigarettes, be irritable, feel very hungry, cough often, get headaches, or have difficulty concentrating. The withdrawal symptoms are only temporary. They are strongest  when you first quit, but they will go away within 10-14 days. To reduce the chances of relapse, try to:  Avoid drinking alcohol. Drinking lowers your chances of successfully quitting.  Reduce the amount of caffeine you consume. Once you quit smoking, the amount of caffeine in your body increases and can give you symptoms, such as a rapid heartbeat, sweating, and anxiety.  Avoid smokers because they can make you want to smoke.  Do not let weight gain distract you. Many smokers will gain weight when they quit, usually less than 10 pounds. Eat a healthy diet and stay active. You can always lose the weight gained after you quit.  Find ways to improve your mood other than smoking. FOR MORE INFORMATION  www.smokefree.gov  Document Released: 09/02/2001 Document Revised: 01/23/2014 Document Reviewed: 12/18/2011 [facility] Patient Information 2015 [place], [place]. This information is not intended to replace advice given to you by your health care provider. Make sure you discuss any questions you have with your health care provider.   Secondhand Smoke Secondhand smoke is the smoke exhaled by smokers and the smoke given off by a burning cigarette, cigar,  or pipe. When a cigarette is smoked, about half of the smoke is inhaled and exhaled by the smoker, and the other half floats around in the air. Exposure to secondhand smoke is also called involuntary smoking or passive smoking. People can be exposed to secondhand smoke in:   Homes.  Cars.  Workplaces.  Public places (bars, restaurants, other recreation sites). Exposure to secondhand smoke is hazardous.It contains more than 250 harmful chemicals, including at least 60 that can cause cancer. These chemicals include:  Arsenic, a heavy metal toxin.  Benzene, a chemical found in gasoline.  Beryllium, a toxic metal.  Cadmium, a metal used in batteries.  Chromium, a metallic element.  Ethylene oxide, a chemical used to sterilize medical  devices.  Nickel, a metallic element.  Polonium-210, a chemical element that gives off radiation.  Vinyl chloride, a toxic substance used in the [employer] research officer, government. Nonsmoking spouses and family members of smokers have higher rates of cancer, heart disease, and serious respiratory illnesses than those not exposed to secondhand smoke.  Nicotine, a nicotine by-product called cotinine, carbon monoxide, and other evidence of secondhand smoke exposure have been found in the body fluids of nonsmokers exposed to secondhand smoke.  Living with a smoker may increase a nonsmoker's chances of developing lung cancer by 20 to 30 percent.  Secondhand smoke may increase the risk of breast cancer, nasal sinus cavity cancer, cervical cancer, bladder cancer, and nose and throat (nasopharyngeal) cancer in adults.  Secondhand smoke may increase the risk of heart disease by 25 to 30 percent. Children are especially at risk from secondhand smoke exposure. Children of smokers have higher rates of:  Pneumonia.  Asthma.  Smoking.  Tonsilitis.  School absences. Research suggests that exposure to secondhand smoke may cause leukemia, lymphoma, and brain tumors in children. Babies are three times more likely to die from sudden infant death syndrome (SIDS) if their mothers smoked during and after pregnancy. There is no safe level of exposure to secondhand smoke. Studies have shown that even low levels of exposure can be harmful. The only way to fully protect nonsmokers from secondhand smoke exposure is to completely eliminate smoking in indoor spaces. The best thing you can do for your own health and for your children's health is to stop smoking. You should stop as soon as possible. This is not easy, and you may fail several times at quitting before you get free of this addiction. Nicotine replacement therapy ( such as patches, gum, or lozenges) can help. These therapies can help you deal with the physical  symptoms of withdrawal. Attending quit-smoking support groups can help you deal with the emotional issues of quitting smoking.  Even if you are not ready to quit right now, there are some simple changes you can make to reduce the effect of your smoking on your family:  Do not smoke in your home. Smoke away from your home in an open area, preferably outside.  Ask others to not smoke in your home.  Do not smoke while holding a child or when children are near.  Do not smoke in your car.  Avoid restaurants, day care centers, and other places that allow smoking. Document Released: 10/16/2004 Document Revised: 06/02/2012 Document Reviewed: 12/23/2013 [facility] Patient Information 2015 [place], [place]. This information is not intended to replace advice given to you by your health care provider. Make sure you discuss any questions you have with your health care provider.

## 2015-05-15 LAB — FLOW CYTOMETRY

## 2015-05-25 ENCOUNTER — Telehealth: Payer: Self-pay | Admitting: Internal Medicine

## 2015-05-25 NOTE — Telephone Encounter (Signed)
lvm for pt regarding to time change of the 9.12 appt...mailed pt appt sched and letter

## 2015-06-04 ENCOUNTER — Other Ambulatory Visit (HOSPITAL_BASED_OUTPATIENT_CLINIC_OR_DEPARTMENT_OTHER): Payer: 59

## 2015-06-04 ENCOUNTER — Encounter: Payer: Self-pay | Admitting: Internal Medicine

## 2015-06-04 ENCOUNTER — Other Ambulatory Visit: Payer: 59

## 2015-06-04 ENCOUNTER — Ambulatory Visit: Payer: 59 | Admitting: Internal Medicine

## 2015-06-04 ENCOUNTER — Ambulatory Visit (HOSPITAL_BASED_OUTPATIENT_CLINIC_OR_DEPARTMENT_OTHER): Payer: 59 | Admitting: Internal Medicine

## 2015-06-04 VITALS — BP 135/79 | HR 92 | Temp 99.3°F | Resp 18 | Ht 65.5 in | Wt 185.9 lb

## 2015-06-04 DIAGNOSIS — D72829 Elevated white blood cell count, unspecified: Secondary | ICD-10-CM | POA: Diagnosis not present

## 2015-06-04 DIAGNOSIS — Z720 Tobacco use: Secondary | ICD-10-CM

## 2015-06-04 LAB — CBC WITH DIFFERENTIAL/PLATELET
BASO%: 0.8 % (ref 0.0–2.0)
Basophils Absolute: 0.1 10*3/uL (ref 0.0–0.1)
EOS%: 2.7 % (ref 0.0–7.0)
Eosinophils Absolute: 0.3 10*3/uL (ref 0.0–0.5)
HEMATOCRIT: 41.1 % (ref 34.8–46.6)
HEMOGLOBIN: 14.2 g/dL (ref 11.6–15.9)
LYMPH#: 3.1 10*3/uL (ref 0.9–3.3)
LYMPH%: 27.7 % (ref 14.0–49.7)
MCH: 32.5 pg (ref 25.1–34.0)
MCHC: 34.5 g/dL (ref 31.5–36.0)
MCV: 94.2 fL (ref 79.5–101.0)
MONO#: 0.7 10*3/uL (ref 0.1–0.9)
MONO%: 6 % (ref 0.0–14.0)
NEUT#: 7 10*3/uL — ABNORMAL HIGH (ref 1.5–6.5)
NEUT%: 62.8 % (ref 38.4–76.8)
Platelets: 280 10*3/uL (ref 145–400)
RBC: 4.36 10*6/uL (ref 3.70–5.45)
RDW: 13 % (ref 11.2–14.5)
WBC: 11.2 10*3/uL — AB (ref 3.9–10.3)

## 2015-06-04 NOTE — Progress Notes (Signed)
[facility] [phone]   [phone]  OFFICE PROGRESS NOTE  Marjorie Smolder, MD [address] [place] [address]  DIAGNOSIS: Reactive leukocytosis.  PRIOR THERAPY: None  CURRENT THERAPY: Observation  INTERVAL HISTORY: Latoya Wells 54 y.o. female returns to the clinic today for follow-up visit. The patient denied having any significant complaints today except for arthralgia on the left hip. She denied having any fatigue or weakness. She has no chest pain, shortness breath, cough or hemoptysis. She has no nausea or vomiting. She has no weight loss or night sweats. The patient had several studies for evaluation of leukocytosis including flow cytometry of the peripheral blood that showed no findings concerning for chronic lymphocytic leukemia. She also had no prior study for BCR/ABL that showed no detectable copies. She is here for evaluation and discussion of her lab results.  MEDICAL HISTORY: Past Medical History  Diagnosis Date  . Diabetes mellitus without complication   . Hypertension     ALLERGIES:  is allergic to lisinopril and hydromorphone.  MEDICATIONS:  Current Outpatient Prescriptions  Medication Sig Dispense Refill  . aspirin 81 MG tablet Take 81 mg by mouth daily.    . hydrochlorothiazide (HYDRODIURIL) 25 MG tablet Take 25 mg by mouth daily.    . metFORMIN (GLUCOPHAGE-XR) 500 MG 24 hr tablet Take 500 mg by mouth daily with breakfast.    . pravastatin (PRAVACHOL) 20 MG tablet Take 20 mg by mouth daily.     No current facility-administered medications for this visit.    SURGICAL HISTORY:  Past Surgical History  Procedure Laterality Date  . Cholecystectomy      REVIEW OF SYSTEMS:  A comprehensive review of systems was negative except for: Musculoskeletal: positive for arthralgias   PHYSICAL EXAMINATION: General appearance: alert, cooperative and no distress Head: Normocephalic, without obvious  abnormality, atraumatic Neck: no adenopathy, no JVD, supple, symmetrical, trachea midline and thyroid not enlarged, symmetric, no tenderness/mass/nodules Lymph nodes: Cervical, supraclavicular, and axillary nodes normal. Resp: clear to auscultation bilaterally Back: symmetric, no curvature. ROM normal. No CVA tenderness. Cardio: regular rate and rhythm, S1, S2 normal, no murmur, click, rub or gallop GI: soft, non-tender; bowel sounds normal; no masses,  no organomegaly Extremities: extremities normal, atraumatic, no cyanosis or edema  ECOG PERFORMANCE STATUS: 0 - Asymptomatic  Blood pressure 135/79, pulse 92, temperature 99.3 F (37.4 C), temperature source Oral, resp. rate 18, height 5' 5.5" (1.664 m), weight 185 lb 14.4 oz (84.324 kg), SpO2 98 %.  LABORATORY DATA: Lab Results  Component Value Date   WBC 11.2* 06/04/2015   HGB 14.2 06/04/2015   HCT 41.1 06/04/2015   MCV 94.2 06/04/2015   PLT 280 06/04/2015      Chemistry      Component Value Date/Time   NA 141 05/10/2015 1053   NA 138 09/06/2014 1729   K 3.6 05/10/2015 1053   K 3.9 09/06/2014 1729   CL 101 09/06/2014 1729   CO2 22 05/10/2015 1053   CO2 24 09/06/2014 1729   BUN 15.6 05/10/2015 1053   BUN 19 09/06/2014 1729   CREATININE 0.7 05/10/2015 1053   CREATININE 0.71 09/06/2014 1729      Component Value Date/Time   CALCIUM 9.5 05/10/2015 1053   CALCIUM 9.3 09/06/2014 1729   ALKPHOS 93 05/10/2015 1053   ALKPHOS 90 09/06/2014 1729   AST 13 05/10/2015 1053   AST 16 09/06/2014 1729   ALT 20 05/10/2015 1053  ALT 15 09/06/2014 1729   BILITOT 0.28 05/10/2015 1053   BILITOT 0.3 09/06/2014 1729       RADIOGRAPHIC STUDIES: No results found.  ASSESSMENT AND PLAN: This is a very pleasant 54 years old white female with persistent leukocytosis most likely reactive in nature secondary to smoking and inflammatory process. The molecular study for BCR/ABL as well as a flow cytometry of the peripheral blood showed no  significant abnormalities. Her total white blood count is better than last visit. I discussed the lab result with the patient today. I recommended for her to continue on observation with routine follow-up visit with her primary care physician. I strongly encouraged her to quit smoking and she is working on this. I will see the patient on as-needed basis at this point. She was advised to call if she has any concerning symptoms. The patient voices understanding of current disease status and treatment options and is in agreement with the current care plan.  All questions were answered. The patient knows to call the clinic with any problems, questions or concerns. We can certainly see the patient much sooner if necessary.  Disclaimer: This note was dictated with voice recognition software. Similar sounding words can inadvertently be transcribed and may not be corrected upon review.

## 2015-09-04 ENCOUNTER — Other Ambulatory Visit: Payer: Self-pay | Admitting: Obstetrics & Gynecology

## 2015-09-04 DIAGNOSIS — R928 Other abnormal and inconclusive findings on diagnostic imaging of breast: Secondary | ICD-10-CM

## 2015-09-11 ENCOUNTER — Ambulatory Visit
Admission: RE | Admit: 2015-09-11 | Discharge: 2015-09-11 | Disposition: A | Discharge status: Home / Self Care | Payer: 59 | Source: Ambulatory Visit | Attending: Obstetrics & Gynecology | Admitting: Obstetrics & Gynecology

## 2015-09-11 DIAGNOSIS — R928 Other abnormal and inconclusive findings on diagnostic imaging of breast: Secondary | ICD-10-CM

## 2017-09-30 ENCOUNTER — Telehealth (HOSPITAL_COMMUNITY): Payer: Self-pay | Admitting: Family Medicine

## 2017-09-30 ENCOUNTER — Ambulatory Visit: Payer: 59 | Admitting: Cardiology

## 2017-10-01 ENCOUNTER — Other Ambulatory Visit: Payer: Self-pay | Admitting: Family Medicine

## 2017-10-01 DIAGNOSIS — H543 Unqualified visual loss, both eyes: Secondary | ICD-10-CM

## 2017-10-05 NOTE — Telephone Encounter (Signed)
User: Cherie Dark A Date/time: 10/02/17 10:18 AM  Comment: I called Nita and spoke with her to inform her that I tried to contact the patient 3 times and have not receieved a call back.   Context:  Outcome: Completed  Phone number: [phone] Phone Type:   Comm. type: Telephone Call type: Outgoing  Contact: Nanita Relation to patient: Provider    User: Cherie Dark A Date/time: 10/02/17 10:15 AM  Comment: Called pt and lmsg for her to CB to get scheduled for an echo.  Context:  Outcome: Left Message  Phone number: [phone] Phone Type: Home Phone  Comm. type: Telephone Call type: Outgoing  Contact: Yvonna Alanis R Relation to patient: Self    User: Cherie Dark A Date/time: 10/01/17 9:48 AM  Comment: Called pt and lmsg for her to CB to sch echo.   Context:  Outcome: Left Message  Phone number: [phone] Phone Type: Home Phone  Comm. type: Telephone Call type: Outgoing  Contact: Yvonna Alanis R Relation to patient: Self    User: Cherie Dark A Date/time: 09/30/17 11:25 AM  Comment: Called pt and lmsg for her to cb to get sch for echo.   Context:  Outcome: Left Message  Phone number: [phone] Phone Type: Home Phone  Comm. type: Telephone Call type: Outgoing  Contact: Yvonna Alanis R Relation to patient: Self

## 2017-10-07 ENCOUNTER — Ambulatory Visit
Admission: RE | Admit: 2017-10-07 | Discharge: 2017-10-07 | Disposition: A | Discharge status: Home / Self Care | Payer: BLUE CROSS/BLUE SHIELD | Source: Ambulatory Visit | Attending: Family Medicine | Admitting: Family Medicine

## 2017-10-07 ENCOUNTER — Ambulatory Visit
Admission: RE | Admit: 2017-10-07 | Discharge: 2017-10-07 | Disposition: A | Discharge status: Home / Self Care | Payer: 59 | Source: Ambulatory Visit | Attending: Family Medicine | Admitting: Family Medicine

## 2017-10-07 DIAGNOSIS — H543 Unqualified visual loss, both eyes: Secondary | ICD-10-CM

## 2017-10-07 MED ORDER — GADOBENATE DIMEGLUMINE 529 MG/ML IV SOLN
18.0000 mL | Freq: Once | INTRAVENOUS | Status: AC | PRN
  Administered 2017-10-07: 18 mL via INTRAVENOUS

## 2017-11-03 ENCOUNTER — Encounter: Payer: Self-pay | Admitting: *Deleted

## 2017-11-09 ENCOUNTER — Ambulatory Visit: Payer: 59 | Admitting: Cardiology

## 2017-11-18 ENCOUNTER — Other Ambulatory Visit: Payer: Self-pay

## 2017-11-18 ENCOUNTER — Encounter: Payer: Self-pay | Admitting: Vascular Surgery

## 2017-11-18 ENCOUNTER — Ambulatory Visit: Payer: BLUE CROSS/BLUE SHIELD | Admitting: Vascular Surgery

## 2017-11-18 VITALS — BP 136/87 | HR 90 | Temp 97.8°F | Resp 16 | Ht 65.0 in | Wt 180.0 lb

## 2017-11-18 DIAGNOSIS — I6523 Occlusion and stenosis of bilateral carotid arteries: Secondary | ICD-10-CM

## 2017-11-18 DIAGNOSIS — I739 Peripheral vascular disease, unspecified: Secondary | ICD-10-CM | POA: Diagnosis not present

## 2017-11-18 NOTE — Progress Notes (Signed)
Patient name: Latoya Wells MRN: [id number] DOB: 11/12/1960 Sex: female   REASON FOR CONSULT:    Carotid disease.  The consult is requested by Dr. Darcus Austin.  HPI:   Latoya Wells is a pleasant 57 y.o. female, who developed a sudden headache on the right side that lasted only seconds at work recently.  She also does describe some sparkles in her eyes.  The symptoms only lasted 5-7 seconds.  She states that this happened once before when her potassium was low.  She describes the headache as a "brain freeze."  She denies any focal weakness or paresthesias.  She denies any expressive or receptive aphasia or amaurosis fugax.  She is on aspirin and is on a statin.  She smokes 1-1/2 packs/day of cigarettes and has been smoking for as long as she can remember.  Her workup included a carotid duplex scan which showed a moderate 50-69% right carotid stenosis.  She was sent for vascular consultation.  Risk factors for peripheral vascular disease include diabetes, hypertension, hypercholesterolemia, a family history of premature cardiovascular disease and a history of tobacco use.  She also describes pain in her left hip.  She had fallen on a stump while working in the yard and has been seen by an Secondary school teacher.  She underwent an injection which did not make this worse.  She states that the pain in her hip is worsened by ambulation and relieved with rest.  I think this could represent hip claudication.  She denies any symptoms in the right leg.  She denies any thigh or calf claudication on the left.  She denies any history of rest pain or nonhealing ulcers.  Past Medical History:  Diagnosis Date  . Diabetes mellitus without complication ([place])   . Hyperlipidemia   . Hypertension   . Migraines     History reviewed. No pertinent family history.  She states that her father had a heart attack in his 54s.  SOCIAL HISTORY: She smokes 1-1/2 packs/day of cigarettes.  She has been smoking for as  long as she remembers. Social History   Socioeconomic History  . Marital status: Divorced    Spouse name: Not on file  . Number of children: Not on file  . Years of education: Not on file  . Highest education level: Not on file  Social Needs  . Financial resource strain: Not on file  . Food insecurity - worry: Not on file  . Food insecurity - inability: Not on file  . Transportation needs - medical: Not on file  . Transportation needs - non-medical: Not on file  Occupational History  . Not on file  Tobacco Use  . Smoking status: Current Every Day Smoker    Packs/day: 1.00    Years: 15.00    Pack years: 15.00    Types: Cigarettes  . Smokeless tobacco: Current User  Substance and Sexual Activity  . Alcohol use: No  . Drug use: No  . Sexual activity: Not on file  Other Topics Concern  . Not on file  Social History Narrative  . Not on file    Allergies  Allergen Reactions  . Lisinopril Rash  . Other Other (See Comments)    Had reaction to bladder antibiotics--generic medication--name unknown to patient  . Hydromorphone Rash    Current Outpatient Medications  Medication Sig Dispense Refill  . ALPRAZolam (XANAX) 0.5 MG tablet Take 0.5 mg by mouth daily as needed for anxiety.    Marland Kitchen  aspirin 81 MG tablet Take 81 mg by mouth daily.    Marland Kitchen esomeprazole (NEXIUM) 10 MG packet Take 10 mg by mouth daily before breakfast.    . glipiZIDE (GLUCOTROL XL) 5 MG 24 hr tablet Take 5 mg by mouth daily with breakfast.    . hydrochlorothiazide (HYDRODIURIL) 25 MG tablet Take 25 mg by mouth daily.    . metFORMIN (GLUCOPHAGE-XR) 500 MG 24 hr tablet Take 500 mg by mouth daily with breakfast.    . pravastatin (PRAVACHOL) 40 MG tablet Take 40 mg by mouth daily.  1  . pravastatin (PRAVACHOL) 20 MG tablet Take 20 mg by mouth daily.     No current facility-administered medications for this visit.     REVIEW OF SYSTEMS:  [X]  denotes positive finding, [ ]  denotes negative finding Cardiac   Comments:  Chest pain or chest pressure:    Shortness of breath upon exertion:    Short of breath when lying flat:    Irregular heart rhythm:        Vascular    Pain in calf, thigh, or hip brought on by ambulation: x   Pain in feet at night that wakes you up from your sleep:     Blood clot in your veins:    Leg swelling:         Pulmonary    Oxygen at home:    Productive cough:     Wheezing:         Neurologic    Sudden weakness in arms or legs:     Sudden numbness in arms or legs:     Sudden onset of difficulty speaking or slurred speech:    Temporary loss of vision in one eye:     Problems with dizziness:         Gastrointestinal    Blood in stool:     Vomited blood:         Genitourinary    Burning when urinating:     Blood in urine:        Psychiatric    Major depression:         Hematologic    Bleeding problems:    Problems with blood clotting too easily:        Skin    Rashes or ulcers:        Constitutional    Fever or chills:     PHYSICAL EXAM:   Vitals:   11/18/17 1107 11/18/17 1112  BP: 140/80 136/87  Pulse: 90   Resp: 16   Temp: 97.8 F (36.6 C)   TempSrc: Oral   SpO2: 96%   Weight: 180 lb (81.6 kg)   Height: 5' 5"  (1.651 m)     GENERAL: The patient is a well-nourished female, in no acute distress. The vital signs are documented above. CARDIAC: There is a regular rate and rhythm.  VASCULAR: I do not detect carotid bruits. On the right side she has a palpable femoral pulse and dorsalis pedis and posterior tibial pulse. On the left side she has a markedly diminished femoral pulse.  I cannot palpate pedal pulses on the left. She has no lower extremity swelling. PULMONARY: There is good air exchange bilaterally without wheezing or rales. ABDOMEN: Soft and non-tender with normal pitched bowel sounds.  I do not palpate an abdominal aortic aneurysm. MUSCULOSKELETAL: There are no major deformities or cyanosis. NEUROLOGIC: No focal weakness or  paresthesias are detected. SKIN: There are no ulcers or rashes noted.  PSYCHIATRIC: The patient has a normal affect.  DATA:    CAROTID DUPLEX: I have reviewed the carotid duplex scan that was done by [facility] on 10/07/2017 this shows a less than 50% left carotid stenosis with a 50-69% right carotid stenosis.  Both vertebral arteries were patent with antegrade flow.  LABS: I reviewed her labs from 09/28/2017 which showed normal renal function.  Creatinine is 0.67.  GFR is 90.  Her ESR was 34 which is elevated.  MR BRAIN: I reviewed the MRI of the brain that was done on 10/07/2017.  This shows 2 tiny chronic infarcts in the right posterior circulation vascular territory.  There was no acute intracranial abnormality.  MEDICAL ISSUES:   50-69% ASYMPTOMATIC RIGHT CAROTID STENOSIS: I do not think that her headache and visual disturbance can be attributed to her right carotid stenosis.  She has no significant carotid stenosis on the left.  I explained we would not consider carotid endarterectomy unless she developed focal right hemispheric symptoms or the stenosis progressed to greater than 80%.  I have ordered a follow-up carotid duplex scan in 6 months and I will see her back at that time.  We have discussed the importance of tobacco cessation.  We have also discussed the importance of exercise and nutrition.  She is on aspirin and is on a statin.  If she continues to have headaches on the right with some visual disturbance the other consideration would be temporal arteritis.  She does have a mildly elevated sed rate.  If her symptoms progress that I would be happy to consider the right temporal artery biopsy.  PERIPHERAL VASCULAR DISEASE: She does have evidence of left iliac artery occlusive disease which might explain some of her left hip pain.  I have encouraged her again to quit smoking and to stay as active as possible.  I have ordered follow-up ABIs when she returns in 6 months for her  carotid duplex scan.  She knows to call sooner if she has problems.  Deitra Mayo [facility] [phone]

## 2017-11-25 ENCOUNTER — Other Ambulatory Visit: Payer: Self-pay | Admitting: Family Medicine

## 2017-11-25 DIAGNOSIS — Z0000 Encounter for general adult medical examination without abnormal findings: Secondary | ICD-10-CM

## 2017-11-25 DIAGNOSIS — F17200 Nicotine dependence, unspecified, uncomplicated: Secondary | ICD-10-CM

## 2017-12-09 ENCOUNTER — Other Ambulatory Visit: Payer: Self-pay

## 2017-12-09 DIAGNOSIS — I739 Peripheral vascular disease, unspecified: Secondary | ICD-10-CM

## 2017-12-09 DIAGNOSIS — I6529 Occlusion and stenosis of unspecified carotid artery: Secondary | ICD-10-CM

## 2018-01-19 ENCOUNTER — Ambulatory Visit
Admission: RE | Admit: 2018-01-19 | Discharge: 2018-01-19 | Disposition: A | Discharge status: Home / Self Care | Payer: BLUE CROSS/BLUE SHIELD | Source: Ambulatory Visit | Attending: Family Medicine | Admitting: Family Medicine

## 2018-01-19 DIAGNOSIS — F17200 Nicotine dependence, unspecified, uncomplicated: Secondary | ICD-10-CM

## 2018-01-19 DIAGNOSIS — Z0000 Encounter for general adult medical examination without abnormal findings: Secondary | ICD-10-CM

## 2018-02-10 ENCOUNTER — Other Ambulatory Visit (HOSPITAL_COMMUNITY): Payer: Self-pay | Admitting: Family Medicine

## 2018-02-10 ENCOUNTER — Telehealth (HOSPITAL_COMMUNITY): Payer: Self-pay | Admitting: Family Medicine

## 2018-02-10 DIAGNOSIS — R079 Chest pain, unspecified: Secondary | ICD-10-CM

## 2018-02-11 NOTE — Telephone Encounter (Signed)
User: Cherie Dark A Date/time: 02/10/18 10:33 AM  Comment: Called pt and lmsg for her to CB to get sch for an echo.Vassie Moment  Context:  Outcome: Left Message  Phone number: [phone] Phone Type: Home Phone  Comm. type: Telephone Call type: Outgoing  Contact: Yvonna Alanis R Relation to patient: Self

## 2018-02-16 ENCOUNTER — Other Ambulatory Visit: Payer: Self-pay

## 2018-02-16 ENCOUNTER — Ambulatory Visit (HOSPITAL_COMMUNITY): Payer: BLUE CROSS/BLUE SHIELD | Attending: Cardiology

## 2018-02-16 DIAGNOSIS — I5189 Other ill-defined heart diseases: Secondary | ICD-10-CM | POA: Insufficient documentation

## 2018-02-16 DIAGNOSIS — I348 Other nonrheumatic mitral valve disorders: Secondary | ICD-10-CM | POA: Diagnosis not present

## 2018-02-16 DIAGNOSIS — R079 Chest pain, unspecified: Secondary | ICD-10-CM

## 2018-05-19 ENCOUNTER — Ambulatory Visit: Payer: BLUE CROSS/BLUE SHIELD | Admitting: Vascular Surgery

## 2018-05-19 ENCOUNTER — Ambulatory Visit (HOSPITAL_COMMUNITY): Payer: BLUE CROSS/BLUE SHIELD

## 2019-01-25 ENCOUNTER — Other Ambulatory Visit: Payer: Self-pay | Admitting: Family Medicine

## 2019-01-25 DIAGNOSIS — F17200 Nicotine dependence, unspecified, uncomplicated: Secondary | ICD-10-CM

## 2021-07-18 ENCOUNTER — Other Ambulatory Visit: Payer: Self-pay | Admitting: Family Medicine

## 2021-07-18 ENCOUNTER — Other Ambulatory Visit (HOSPITAL_COMMUNITY)
Admission: RE | Admit: 2021-07-18 | Discharge: 2021-07-18 | Disposition: A | Discharge status: Home / Self Care | Payer: BC Managed Care – PPO | Source: Ambulatory Visit | Attending: Family Medicine | Admitting: Family Medicine

## 2021-07-18 DIAGNOSIS — Z01411 Encounter for gynecological examination (general) (routine) with abnormal findings: Secondary | ICD-10-CM | POA: Insufficient documentation

## 2021-07-19 LAB — CYTOLOGY - PAP
Comment: NEGATIVE
Diagnosis: NEGATIVE
High risk HPV: NEGATIVE

## 2021-12-11 ENCOUNTER — Encounter: Payer: Self-pay | Admitting: Family Medicine

## 2021-12-11 ENCOUNTER — Ambulatory Visit: Payer: BC Managed Care – PPO | Admitting: Family Medicine

## 2021-12-11 ENCOUNTER — Encounter: Payer: Self-pay | Admitting: *Deleted

## 2021-12-11 VITALS — BP 130/70 | HR 94 | Temp 98.1°F | Ht 65.0 in | Wt 167.4 lb

## 2021-12-11 DIAGNOSIS — E1165 Type 2 diabetes mellitus with hyperglycemia: Secondary | ICD-10-CM | POA: Diagnosis not present

## 2021-12-11 DIAGNOSIS — Z1231 Encounter for screening mammogram for malignant neoplasm of breast: Secondary | ICD-10-CM | POA: Diagnosis not present

## 2021-12-11 DIAGNOSIS — I10 Essential (primary) hypertension: Secondary | ICD-10-CM | POA: Diagnosis not present

## 2021-12-11 DIAGNOSIS — E7800 Pure hypercholesterolemia, unspecified: Secondary | ICD-10-CM

## 2021-12-11 NOTE — Patient Instructions (Signed)
Welcome to [facility]! It was a pleasure meeting you today. ? ?As discussed, Please schedule a 3 month follow up visit today. ? ?Try Losartan 1/2 tab twice daily ? ?Exercise.  Work on diet ? ?PLEASE NOTE: ? ?If you had any LAB tests please let us know if you have not heard back within a few days. You may see your results on MyChart before we have a chance to review them but we will give you a call once they are reviewed by [place]. If we ordered any REFERRALS today, please let us know if you have not heard from their office within the next week.  ?Let us know through MyChart if you are needing REFILLS, or have your pharmacy send [place] the request. You can also use MyChart to communicate with me or any office staff. ? ?Please try these tips to maintain a healthy lifestyle: ? ?Eat most of your calories during the day when you are active. Eliminate processed foods including packaged sweets (pies, cakes, cookies), reduce intake of potatoes, white bread, white pasta, and white rice. Look for whole grain options, oat flour or almond flour. ? ?Each meal should contain half fruits/vegetables, one quarter protein, and one quarter carbs (no bigger than a computer mouse). ? ?Cut down on sweet beverages. This includes juice, soda, and sweet tea. Also watch fruit intake, though this is a healthier sweet option, it still contains natural sugar! Limit to 3 servings daily. ? ?Drink at least 1 glass of water with each meal and aim for at least 8 glasses per day ? ?Exercise at least 150 minutes every week.   ?

## 2021-12-11 NOTE — Progress Notes (Signed)
? ?New Patient Office Visit ? ?Subjective:  ?Patient ID: Latoya Wells, female    DOB: 1960-11-29  Age: 61 y.o. MRN: [id number] ? ?CC:  ?Chief Complaint  ?Patient presents with  ? Establish Care  ? ? ?HPI ?[name] presents for new pt.  Doc retired and new doc changing meds. ? DM type 2 w/hyperglycemia-A1C 9.2 on 10/18/21-on Jardiance and metformin.  Ozempic made lips tingle.  Checks sugars once/month.   ?HTN-taking losartan. 145-150/70's in am.  Later in day, 117-130/70's.  Walks all day at work No  ha/dizziness/cp/palp/edema/new cough/sob.  Was on hctz but K low and was having issues.  ?HLD-doing well on pravastatin.  LDL 77 on 07/18/21 ? ?Lives w/Mom-was trying to get her here as well, but mom canc appt ?Past Medical History:  ?Diagnosis Date  ? Allergy   ? Cataract   ? Diabetes mellitus without complication ([place])   ? Hyperlipidemia   ? Hypertension   ? ? ?Past Surgical History:  ?Procedure Laterality Date  ? CHOLECYSTECTOMY    ? TUBAL LIGATION    ? ? ?Family History  ?Problem Relation Age of Onset  ? Hypertension Mother   ? Heart disease Mother   ? Arthritis Mother   ? Diabetes Sister   ? ? ?Social History  ? ?Socioeconomic History  ? Marital status: Divorced  ?  Spouse name: Not on file  ? Number of children: 1  ? Years of education: Not on file  ? Highest education level: Not on file  ?Occupational History  ? Not on file  ?Tobacco Use  ? Smoking status: Former  ?  Packs/day: 1.00  ?  Years: 15.00  ?  Pack years: 15.00  ?  Types: Cigarettes  ?  Quit date: 2019  ?  Years since quitting: 4.2  ? Smokeless tobacco: Never  ?Vaping Use  ? Vaping Use: Former  ? Substances: Nicotine  ?Substance and Sexual Activity  ? Alcohol use: No  ? Drug use: No  ?  Types: Marijuana  ? Sexual activity: Not Currently  ?Other Topics Concern  ? Not on file  ?Social History Narrative  ? 1 son.  ? 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 ?Social Determinants of Health  ? ?Financial Resource Strain: Not on file  ?Food Insecurity: Not on  file  ?Transportation Needs: Not on file  ?Physical Activity: Not on file  ?Stress: Not on file  ?Social Connections: Not on file  ?Intimate Partner Violence: Not on file  ? ? ?ROS  ?ROS: ?Gen: no fever, chills  ?Skin: no rash, itching ?ENT: no ear pain, ear drainage, nasal congestion, rhinorrhea, sinus pressure, sore throat ?Eyes: no blurry vision, double vision ?Resp: no cough, wheeze,SOB ?Breast: no breast tenderness, no nipple discharge, no breast masses ?CV: no CP, palpitations, LE edema,  ?GI: no heartburn, n/v/d/c, abd pain ?GU: no dysuria, urgency, frequency, hematuria ?MSK: some sciatica ?Neuro: no dizziness, headache, weakness, vertigo ?Psych: no depression, anxiety, insomnia, SI  ? ? ?Objective:  ? ?Today's Vitals: BP 130/70   Pulse 94   Temp 98.1 ?F (36.7 ?C) (Temporal)   Ht '5\' 5"'$  (1.651 m)   Wt 167 lb 6 oz (75.9 kg)   SpO2 98%   BMI 27.85 kg/m?  ? ?Physical Exam  ?Gen: WDWN NAD WF ?HEENT: NCAT, conjunctiva not injected, sclera nonicteric ?TM WNL B, OP moist, no exudates  ?NECK:  supple, no thyromegaly, no nodes, no carotid bruits ?CARDIAC: RRR, S1S2+, no murmur. DP 1+B ?LUNGS:  CTAB. No wheezes ?ABDOMEN:  BS+, soft, NTND, No HSM, no masses ?EXT:  no edema ?MSK: no gross abnormalities.  ?NEURO: A&O x3.  CN II-XII intact.  ?PSYCH: normal mood. Good eye contact  ? ?Assessment & Plan:  ? ?Problem List Items Addressed This Visit   ? ?  ? Cardiovascular and Mediastinum  ? Primary hypertension  ? Relevant Medications  ? losartan (COZAAR) 100 MG tablet  ?  ? Endocrine  ? Type 2 diabetes mellitus with hyperglycemia, without long-term current use of insulin ([facility]) - Primary  ? Relevant Medications  ? losartan (COZAAR) 100 MG tablet  ? JARDIANCE 10 MG TABS tablet  ?  ? Other  ? Pure hypercholesterolemia  ? Relevant Medications  ? losartan (COZAAR) 100 MG tablet  ? Type 2 dm w/hyperglycemia-spent 35mnutes discussing meds, consequences, risks, etc.  Educated pt.  She will cont jardiance and metformin and work  on THessmer  Check sugars at least weekly.  She wants trial of TLC.  F/u 3 mo.  If sugars remaining over 200, let [place] ?HTN-controlled daytime, but high in am.  Got low K on HCTZ.  Will try 1/2 bid Losartan and see if better.  ?HLD-controlled. Cont meds.  ? ?Outpatient Encounter Medications as of 12/11/2021  ?Medication Sig  ? aspirin 81 MG tablet Take 81 mg by mouth daily.  ? JARDIANCE 10 MG TABS tablet Take 10 mg by mouth daily.  ? losartan (COZAAR) 100 MG tablet Take 100 mg by mouth daily.  ? metFORMIN (GLUCOPHAGE-XR) 500 MG 24 hr tablet Take 500 mg by mouth daily. Take 2 tablet by mouth every morning and 1 tablets every evening  ? pravastatin (PRAVACHOL) 40 MG tablet Take 40 mg by mouth daily.  ? glucose blood test strip Use to test your blood sugar  ? [DISCONTINUED] ALPRAZolam (XANAX) 0.5 MG tablet Take 0.5 mg by mouth daily as needed for anxiety.  ? [DISCONTINUED] esomeprazole (NEXIUM) 10 MG packet Take 10 mg by mouth daily before breakfast.  ? [DISCONTINUED] glipiZIDE (GLUCOTROL XL) 5 MG 24 hr tablet Take 5 mg by mouth daily with breakfast.  ? [DISCONTINUED] hydrochlorothiazide (HYDRODIURIL) 25 MG tablet Take 25 mg by mouth daily.  ? [DISCONTINUED] pravastatin (PRAVACHOL) 20 MG tablet Take 20 mg by mouth daily.  ? ?No facility-administered encounter medications on file as of 12/11/2021.  ? ? ?Follow-up: No follow-ups on file.  ? ?AWellington Hampshire MD ?

## 2021-12-19 ENCOUNTER — Other Ambulatory Visit: Payer: Self-pay | Admitting: *Deleted

## 2021-12-19 ENCOUNTER — Other Ambulatory Visit: Payer: Self-pay | Admitting: Family Medicine

## 2021-12-19 ENCOUNTER — Telehealth: Payer: Self-pay

## 2021-12-19 MED ORDER — AMLODIPINE BESYLATE 5 MG PO TABS: 5.0000 mg | ORAL_TABLET | Freq: Every day | ORAL | 0 refills | Status: DC

## 2021-12-19 NOTE — Telephone Encounter (Signed)
Spoke to patient and she stated that with taking losartan 100 mg, half a pill in the morning and half a pill in the evening she is still having elevated blood pressures of 140 to 145/ 70-80, both morning and evening. Please advise. ?

## 2021-12-19 NOTE — Telephone Encounter (Signed)
Patient notified and verbalized understanding. Rx sent to the pharmacy. ?

## 2021-12-19 NOTE — Telephone Encounter (Signed)
Patient is calling in stating Dr. Cherlynn Kaiser advised her to call back if she did not see that her BP was lowering after changing her dosage of meds.  Patient states her BP has been running 140/78 to 145/81.  Patient is requesting call back in regard.

## 2022-01-16 ENCOUNTER — Other Ambulatory Visit: Payer: Self-pay | Admitting: Family Medicine

## 2022-01-20 ENCOUNTER — Telehealth: Payer: Self-pay | Admitting: Family Medicine

## 2022-01-20 NOTE — Telephone Encounter (Signed)
[facility] -  ?

## 2022-01-20 NOTE — Telephone Encounter (Signed)
[facility] chart. ?

## 2022-01-24 ENCOUNTER — Ambulatory Visit
Admission: RE | Admit: 2022-01-24 | Discharge: 2022-01-24 | Disposition: A | Discharge status: Home / Self Care | Payer: BC Managed Care – PPO | Source: Ambulatory Visit | Attending: Family Medicine | Admitting: Family Medicine

## 2022-01-24 DIAGNOSIS — Z1231 Encounter for screening mammogram for malignant neoplasm of breast: Secondary | ICD-10-CM

## 2022-02-19 ENCOUNTER — Other Ambulatory Visit: Payer: Self-pay | Admitting: *Deleted

## 2022-02-19 ENCOUNTER — Telehealth: Payer: Self-pay | Admitting: Family Medicine

## 2022-02-19 MED ORDER — LOSARTAN POTASSIUM 50 MG PO TABS: 50.0000 mg | ORAL_TABLET | Freq: Two times a day (BID) | ORAL | 1 refills | Status: DC

## 2022-02-19 NOTE — Telephone Encounter (Signed)
Patient stated that she is currently taking losartan 25 mg 2 pills in the morning and 2 pills in the evening. Okay to refill as requested? Please advise.

## 2022-02-19 NOTE — Telephone Encounter (Signed)
Pt is wanting to go back to the 25 mg and taking 2 pills twice a day. Please advise.  . Encourage patient to contact the pharmacy for refills or they can request refills through [place]:  12/11/21  NEXT APPOINTMENT DATE: 03/05/22  MEDICATION:losartan (COZAAR) 25 MG tablet  Is the patient out of medication?   PHARMACY: [id number] [address], [place] Phone:  [phone]  Fax:  [phone]      Let patient know to contact pharmacy at the end of the day to make sure medication is ready.  Please notify patient to allow 48-72 hours to process

## 2022-02-19 NOTE — Telephone Encounter (Signed)
Patient stated that she would rather take 50 mg bid. Rx sent to the pharmacy as requested.

## 2022-03-05 ENCOUNTER — Ambulatory Visit: Payer: BC Managed Care – PPO | Admitting: Family Medicine

## 2022-03-05 ENCOUNTER — Encounter: Payer: Self-pay | Admitting: Family Medicine

## 2022-03-05 VITALS — BP 135/80 | HR 87 | Temp 98.0°F | Ht 65.0 in | Wt 156.4 lb

## 2022-03-05 DIAGNOSIS — Z1159 Encounter for screening for other viral diseases: Secondary | ICD-10-CM

## 2022-03-05 DIAGNOSIS — Z8673 Personal history of transient ischemic attack (TIA), and cerebral infarction without residual deficits: Secondary | ICD-10-CM | POA: Insufficient documentation

## 2022-03-05 DIAGNOSIS — E1165 Type 2 diabetes mellitus with hyperglycemia: Secondary | ICD-10-CM | POA: Insufficient documentation

## 2022-03-05 DIAGNOSIS — I10 Essential (primary) hypertension: Secondary | ICD-10-CM

## 2022-03-05 DIAGNOSIS — E7800 Pure hypercholesterolemia, unspecified: Secondary | ICD-10-CM

## 2022-03-05 DIAGNOSIS — I6529 Occlusion and stenosis of unspecified carotid artery: Secondary | ICD-10-CM | POA: Insufficient documentation

## 2022-03-05 DIAGNOSIS — Z114 Encounter for screening for human immunodeficiency virus [HIV]: Secondary | ICD-10-CM

## 2022-03-05 DIAGNOSIS — F17200 Nicotine dependence, unspecified, uncomplicated: Secondary | ICD-10-CM | POA: Insufficient documentation

## 2022-03-05 DIAGNOSIS — F419 Anxiety disorder, unspecified: Secondary | ICD-10-CM | POA: Insufficient documentation

## 2022-03-05 LAB — HEMOGLOBIN A1C: Hgb A1c MFr Bld: 8.4 % — ABNORMAL HIGH (ref 4.6–6.5)

## 2022-03-05 LAB — LIPID PANEL
Cholesterol: 164 mg/dL (ref 0–200)
HDL: 66.6 mg/dL (ref >39.00)
LDL Cholesterol: 82 mg/dL (ref 0–99)
NonHDL: 97.63
Total CHOL/HDL Ratio: 2
Triglycerides: 78 mg/dL (ref 0.0–149.0)
VLDL: 15.6 mg/dL (ref 0.0–40.0)

## 2022-03-05 LAB — CBC
HCT: 42.1 % (ref 36.0–46.0)
Hemoglobin: 14.2 g/dL (ref 12.0–15.0)
MCHC: 33.7 g/dL (ref 30.0–36.0)
MCV: 94.9 fl (ref 78.0–100.0)
Platelets: 299 10*3/uL (ref 150.0–400.0)
RBC: 4.44 Mil/uL (ref 3.87–5.11)
RDW: 12.9 % (ref 11.5–15.5)
WBC: 7.7 10*3/uL (ref 4.0–10.5)

## 2022-03-05 LAB — COMPREHENSIVE METABOLIC PANEL
ALT: 14 U/L (ref 0–35)
AST: 12 U/L (ref 0–37)
Albumin: 4.4 g/dL (ref 3.5–5.2)
Alkaline Phosphatase: 90 U/L (ref 39–117)
BUN: 17 mg/dL (ref 6–23)
CO2: 25 mEq/L (ref 19–32)
Calcium: 10 mg/dL (ref 8.4–10.5)
Chloride: 103 mEq/L (ref 96–112)
Creatinine, Ser: 0.54 mg/dL (ref 0.40–1.20)
GFR: 98.47 mL/min (ref >60.00)
Glucose, Bld: 160 mg/dL — ABNORMAL HIGH (ref 70–99)
Potassium: 4.6 mEq/L (ref 3.5–5.1)
Sodium: 139 mEq/L (ref 135–145)
Total Bilirubin: 0.5 mg/dL (ref 0.2–1.2)
Total Protein: 7.7 g/dL (ref 6.0–8.3)

## 2022-03-05 LAB — MICROALBUMIN / CREATININE URINE RATIO
Creatinine,U: 29.4 mg/dL
Microalb Creat Ratio: 3.6 mg/g (ref 0.0–30.0)
Microalb, Ur: 1.1 mg/dL (ref 0.0–1.9)

## 2022-03-05 LAB — TSH: TSH: 1.1 u[IU]/mL (ref 0.35–5.50)

## 2022-03-05 LAB — VITAMIN B12: Vitamin B-12: 227 pg/mL (ref 211–911)

## 2022-03-05 NOTE — Progress Notes (Signed)
Subjective:     Patient ID: Latoya Wells, female    DOB: 09-02-1961, 61 y.o.   MRN: [id number]  Chief Complaint  Patient presents with   Diabetes    HPI  DM type 2-taking Jardiance and metformin 2 in am and 1 in eve and cinnamon/chromium supps  Sugars not checking  ophth 1 yr ago.  HTN-Pr is on amlodipine and cozaar.  Bp's running  140's in am but then later in day(1 hr later and in eve) 130's/80's No ha/dizziness/cp/palp/edema/cough/sob.  Walks all day long at work.   HLD-doing well on pravastatin Cscope 2 yrs per pt-polyps-repeat 3 yrs  Health Maintenance Due  Topic Date Due   HEMOGLOBIN A1C  Never done   COVID-19 Vaccine (1) Never done   FOOT EXAM  Never done   OPHTHALMOLOGY EXAM  Never done   HIV Screening  Never done   Hepatitis C Screening  Never done   COLONOSCOPY (Pts 45-46yr Insurance coverage will need to be confirmed)  Never done   Zoster Vaccines- Shingrix (1 of 2) Never done    Past Medical History:  Diagnosis Date   Allergy    Cataract    [place]    Hyperlipidemia    Hypertension     Past Surgical History:  Procedure Laterality Date   CHOLECYSTECTOMY     TUBAL LIGATION      Outpatient Medications Prior to Visit  Medication Sig Dispense Refill   amLODipine (NORVASC) 5 MG tablet TAKE 1 TABLET(5 MG) BY MOUTH DAILY 90 tablet 1   aspirin 81 MG tablet Take 81 mg by mouth daily.     glucose blood test strip Use to test your blood sugar     JARDIANCE 10 MG TABS tablet Take 10 mg by mouth daily.     losartan (COZAAR) 50 MG tablet Take 1 tablet (50 mg total) by mouth 2 (two) times daily at 10 AM and 5 PM. 90 tablet 1   metFORMIN (GLUCOPHAGE-XR) 500 MG 24 hr tablet Take 500 mg by mouth daily. Take 2 tablet by mouth every morning and 1 tablets every evening     pravastatin (PRAVACHOL) 40 MG tablet Take 40 mg by mouth daily.  1   diclofenac (VOLTAREN) 50 MG EC tablet Take 50 mg by mouth 2 (two) times daily. (Patient not  taking: Reported on 03/05/2022)     No facility-administered medications prior to visit.    Allergies  Allergen Reactions   Lisinopril Rash    Other reaction(s): rash, tingling tongue   Hydromorphone Rash    Other reaction(s): rash   Hydromorphone Hcl Rash   Other Other (See Comments)    Had reaction to bladder antibiotics--generic medication--name unknown to patient Other reaction(s): Other Had reaction to bladder antibiotics--generic medication--name unknown to patient Had reaction to bladder antibiotics--generic medication--name unknown to patient Had reaction to bladder antibiotics--generic medication--name unknown to patient    Semaglutide(0.25 Or 0.'5mg'$ -Dos)     Other reaction(s): tingling lips   ROS  ROS: Gen: no fever, chills  Skin: no rash, itching ENT: no ear pain, ear drainage, nasal congestion, rhinorrhea, sinus pressure, sore throat Eyes: no blurry vision, double vision Resp: no cough, wheeze,SOB CV: no CP, palpitations, LE edema,  GI: no heartburn, n/v/d/c, abd pain GU: no dysuria, urgency, frequency, hematuria MSK: no joint pain, myalgias, back pain Neuro: no dizziness, headache, weakness, vertigo Psych: no depression, anxiety, insomnia, SI - had to put dog down last wk.16yo  Objective:     BP 135/80 (BP Location: Right Arm, Patient Position: Sitting, Cuff Size: Large)   Pulse 87   Temp 98 F (36.7 C) (Temporal)   Ht '5\' 5"'$  (1.651 m)   Wt 156 lb 6 oz (70.9 kg)   SpO2 99%   BMI 26.02 kg/m  Wt Readings from Last 3 Encounters:  03/05/22 156 lb 6 oz (70.9 kg)  12/11/21 167 lb 6 oz (75.9 kg)  11/18/17 180 lb (81.6 kg)    Physical Exam   Gen: WDWN NAD HEENT: NCAT, conjunctiva not injected, sclera nonicteric NECK:  supple, no thyromegaly, no nodes, no carotid bruits CARDIAC: RRR, S1S2+, no murmur. DP 2+B LUNGS: CTAB. No wheezes ABDOMEN:  BS+, soft, NTND, No HSM, no masses EXT:  no edema MSK: no gross abnormalities.  NEURO: A&O x3.  CN II-XII  intact.  PSYCH: normal mood. Good eye contact  Foot exam normal     Assessment & Plan:   Problem List Items Addressed This Visit       Cardiovascular and Mediastinum   Primary hypertension     Endocrine   Type 2 diabetes mellitus with hyperglycemia, without long-term current use of insulin ([place]) - Primary     Other   Pure hypercholesterolemia   DM type 2-chronic.  Was hyperglyemic.  Working on [employer].  Cont jardiance '10mg'$  and metformin 1000 in am and 500 in pm.  Check B12, cmp,tsh,a1c,microalb/creat ratio.  Get ophth sch.  Foot exam done today.  F/u 3 mo HTN-chronic. Mostly controlled.  Still a litte elevated in am.  Cont TLC.  Cont cozaar 50 bid.  Take the amlodipine 38min pm and see if am bp's improve.  Check cbc,cmp HLD-chronic.  Controlled on pravastatin '40mg'$ .  Cont.  Check lft's and lipids.   No orders of the defined types were placed in this encounter.   AWellington Hampshire MD

## 2022-03-05 NOTE — Patient Instructions (Signed)
It was very nice to see you today!  Check sugars weekly Try taking amlodipine in the evening and see if am bp's better.   PLEASE NOTE:  If you had any lab tests please let us know if you have not heard back within a few days. You may see your results on MyChart before we have a chance to review them but we will give you a call once they are reviewed by [place]. If we ordered any referrals today, please let us know if you have not heard from their office within the next week.   Please try these tips to maintain a healthy lifestyle:  Eat most of your calories during the day when you are active. Eliminate processed foods including packaged sweets (pies, cakes, cookies), reduce intake of potatoes, white bread, white pasta, and white rice. Look for whole grain options, oat flour or almond flour.  Each meal should contain half fruits/vegetables, one quarter protein, and one quarter carbs (no bigger than a computer mouse).  Cut down on sweet beverages. This includes juice, soda, and sweet tea. Also watch fruit intake, though this is a healthier sweet option, it still contains natural sugar! Limit to 3 servings daily.  Drink at least 1 glass of water with each meal and aim for at least 8 glasses per day  Exercise at least 150 minutes every week.

## 2022-03-06 LAB — HEPATITIS C ANTIBODY
Hepatitis C Ab: NONREACTIVE
SIGNAL TO CUT-OFF: 0.18 (ref <1.00)

## 2022-03-06 LAB — HIV ANTIBODY (ROUTINE TESTING W REFLEX): HIV 1&2 Ab, 4th Generation: NONREACTIVE

## 2022-04-18 ENCOUNTER — Telehealth: Payer: Self-pay | Admitting: Family Medicine

## 2022-04-18 ENCOUNTER — Other Ambulatory Visit: Payer: Self-pay | Admitting: *Deleted

## 2022-04-18 MED ORDER — PRAVASTATIN SODIUM 40 MG PO TABS: 40.0000 mg | ORAL_TABLET | Freq: Every day | ORAL | 1 refills | Status: AC

## 2022-04-18 MED ORDER — METFORMIN HCL ER 500 MG PO TB24: 500.0000 mg | ORAL_TABLET | Freq: Every day | ORAL | 1 refills | Status: DC

## 2022-04-18 NOTE — Telephone Encounter (Signed)
..   Encourage patient to contact the pharmacy for refills or they can request refills through [place]:  Please schedule appointment if longer than 1 year  NEXT APPOINTMENT DATE: 05/2022  MEDICATION: pravastatin (PRAVACHOL) 40 MG tablet  metFORMIN (GLUCOPHAGE-XR) 500 MG 24 hr tablet   Is the patient out of medication? NO   PHARMACY:  Let patient know to contact pharmacy at the end of the day to make sure medication is ready.  Please notify patient to allow 48-72 hours to process

## 2022-04-18 NOTE — Telephone Encounter (Signed)
Rx's sent to the pharmacy. 

## 2022-04-30 ENCOUNTER — Other Ambulatory Visit: Payer: Self-pay | Admitting: Family Medicine

## 2022-05-09 ENCOUNTER — Other Ambulatory Visit: Payer: Self-pay | Admitting: Family Medicine

## 2022-05-09 MED ORDER — METFORMIN HCL ER 500 MG PO TB24: 500.0000 mg | ORAL_TABLET | Freq: Every day | ORAL | 1 refills | Status: DC

## 2022-05-09 NOTE — Telephone Encounter (Signed)
Pt states pharmacy only received a 10 day supply of Metformin. This will not last until her next visit on 06/05/22. Please advise

## 2022-05-09 NOTE — Telephone Encounter (Signed)
Called pt and apologized for mistake, pt accepted.

## 2022-05-12 ENCOUNTER — Other Ambulatory Visit: Payer: Self-pay | Admitting: Family Medicine

## 2022-06-05 ENCOUNTER — Encounter: Payer: Self-pay | Admitting: Family Medicine

## 2022-06-05 ENCOUNTER — Ambulatory Visit: Payer: BC Managed Care – PPO | Admitting: Family Medicine

## 2022-06-05 VITALS — BP 122/84 | HR 90 | Temp 97.9°F | Ht 65.0 in | Wt 153.5 lb

## 2022-06-05 DIAGNOSIS — M5432 Sciatica, left side: Secondary | ICD-10-CM | POA: Diagnosis not present

## 2022-06-05 DIAGNOSIS — E1165 Type 2 diabetes mellitus with hyperglycemia: Secondary | ICD-10-CM | POA: Diagnosis not present

## 2022-06-05 DIAGNOSIS — Z23 Encounter for immunization: Secondary | ICD-10-CM

## 2022-06-05 DIAGNOSIS — R1084 Generalized abdominal pain: Secondary | ICD-10-CM | POA: Diagnosis not present

## 2022-06-05 MED ORDER — OMEPRAZOLE 40 MG PO CPDR: 40.0000 mg | DELAYED_RELEASE_CAPSULE | Freq: Every day | ORAL | 3 refills | Status: AC

## 2022-06-05 MED ORDER — METHOCARBAMOL 500 MG PO TABS: 500.0000 mg | ORAL_TABLET | Freq: Four times a day (QID) | ORAL | 1 refills | Status: DC | PRN

## 2022-06-05 MED ORDER — DICLOFENAC SODIUM 1 % EX GEL: 4.0000 g | Freq: Four times a day (QID) | CUTANEOUS | 3 refills | Status: DC | PRN

## 2022-06-05 MED ORDER — MELOXICAM 7.5 MG PO TABS: 7.5000 mg | ORAL_TABLET | Freq: Every day | ORAL | 1 refills | Status: DC

## 2022-06-05 MED ORDER — METFORMIN HCL ER 500 MG PO TB24: 1000.0000 mg | ORAL_TABLET | Freq: Two times a day (BID) | ORAL | 1 refills | Status: AC

## 2022-06-05 NOTE — Patient Instructions (Signed)
For back-meloxicam once or twice daily.  Tylenol 3x daily.  Methocarbamol for sleep  Omeprazole in am for stomach for 1 month.

## 2022-06-05 NOTE — Progress Notes (Signed)
Subjective:     Patient ID: Latoya Wells, female    DOB: 08-27-1961, 61 y.o.   MRN: [id number]  Chief Complaint  Patient presents with   Follow-up    3 month follow-up on dm and muscle soreness Had coffee with sugar and splenda, no food    HPI  DM type 2-on Jardiance '10mg'$ , metformin '500mg'$  XR,-2 in am, 2 in pm.  pravastatin 40, losartan 50.  A1C was 8.4 in June Hurt back helping son clean house and moved couch 1 mo ago-pain lower back.  Can't sleep-taking tylenol TID. Lower back-rad down L leg to foot.  No n/t.  No b/b dysfunction.  Can't sleep d/t pain.  Not getting worse, but not better.  Stomach burns around naval and upper abd-more at night. No n/v.  Occ heartburn.  Drinks a lot of coffee  Health Maintenance Due  Topic Date Due   OPHTHALMOLOGY EXAM  Never done   COLONOSCOPY (Pts 45-21yr Insurance coverage will need to be confirmed)  Never done    Past Medical History:  Diagnosis Date   Allergy    Cataract    Di[place] mellitus without [place]    Hyperlipidemia    Hypertension     Past Surgical History:  Procedure Laterality Date   CHOLECYSTECTOMY     TUBAL LIGATION      Outpatient Medications Prior to Visit  Medication Sig Dispense Refill   amLODipine (NORVASC) 5 MG tablet TAKE 1 TABLET(5 MG) BY MOUTH DAILY 90 tablet 1   aspirin 81 MG tablet Take 81 mg by mouth daily.     diclofenac (VOLTAREN) 50 MG EC tablet Take 50 mg by mouth 2 (two) times daily.     glucose blood test strip Use to test your blood sugar     JARDIANCE 10 MG TABS tablet TAKE 1 TABLET BY MOUTH EVERY DAY 90 tablet 1   losartan (COZAAR) 50 MG tablet TAKE 1 TABLET(50 MG) BY MOUTH TWICE DAILY AT 10 AM AND AT 5 PM 180 tablet 1   metFORMIN (GLUCOPHAGE-XR) 500 MG 24 hr tablet Take 1 tablet (500 mg total) by mouth daily. Take 2 tablet by mouth every morning and 1 tablets every evening 270 tablet 1   pravastatin (PRAVACHOL) 40 MG tablet Take 1 tablet (40 mg total) by mouth daily. 30 tablet 1    No facility-administered medications prior to visit.    Allergies  Allergen Reactions   Lisinopril Rash and Anaphylaxis    Other reaction(s): rash, tingling tongue   Hydromorphone Rash    Other reaction(s): rash   Hydromorphone Hcl Rash and Other (See Comments)   Other Other (See Comments)    Had reaction to bladder antibiotics--generic medication--name unknown to patient Other reaction(s): Other Had reaction to bladder antibiotics--generic medication--name unknown to patient Had reaction to bladder antibiotics--generic medication--name unknown to patient Had reaction to bladder antibiotics--generic medication--name unknown to patient    Semaglutide(0.25 Or 0.'5mg'$ -Dos)     Other reaction(s): tingling lips   ROS neg/noncontributory except as noted HPI/below      Objective:     BP 122/84   Pulse 90   Temp 97.9 F (36.6 C) (Temporal)   Ht '5\' 5"'$  (1.651 m)   Wt 153 lb 8 oz (69.6 kg)   SpO2 98%   BMI 25.54 kg/m  Wt Readings from Last 3 Encounters:  06/05/22 153 lb 8 oz (69.6 kg)  03/05/22 156 lb 6 oz (70.9 kg)  12/11/21 167 lb 6 oz (75.9  kg)    Physical Exam   Gen: WDWN NAD HEENT: NCAT, conjunctiva not injected, sclera nonicteric NECK:  supple, no thyromegaly, no nodes, no carotid bruits CARDIAC: RRR, S1S2+, no murmur. DP 2+B LUNGS: CTAB. No wheezes ABDOMEN:  BS+, soft, tender mid epi, No HSM, no masses EXT:  no edema MSK: tender R hip bursa.   Back:  can stand on heels/toes/1 leg.  Mild  TTP. No SI tenderness. No rash.  MS 5/5 BLE.  DTR 2+ BLE.  SLR neg B.  Good ROM .  NEURO: A&O x3.  CN II-XII intact.  PSYCH: normal mood. Good eye contact     Assessment & Plan:   Problem List Items Addressed This Visit       Endocrine   Type 2 diabetes mellitus with hyperglycemia, without long-term current use of insulin ([place]) - Primary   Other Visit Diagnoses     Sciatica of left side       Generalized abdominal pain          DM type 2 w/hyperglycemia-not  controlled.  Pt working on diet some.  Doesn't want to add more meds-declines labs today.  F/u 3 mo. L sciatica-mobic 7.'5mg'$ , tylenol, robaxin '500mg'$ .   Pt will call if not improving for PT.  Mid epi pain- omeprazole '40mg'$  daily for 1 mo  No orders of the defined types were placed in this encounter.   Wellington [place], MD

## 2022-06-17 ENCOUNTER — Telehealth: Payer: Self-pay | Admitting: *Deleted

## 2022-06-17 NOTE — Telephone Encounter (Signed)
Called patient to inform her that PA for Voltaren Gel has been denied. Patient can buy OTC or do without per pcp. Left message for patient to return call.

## 2022-06-18 NOTE — Telephone Encounter (Signed)
Patient notified of message below. Patient stated that she did buy OTC, didn't like it so she started using what she had been using before. Patient stated she is not hurting as bad, but will start PT next week.

## 2022-07-14 ENCOUNTER — Encounter: Payer: Self-pay | Admitting: Family Medicine

## 2022-07-14 ENCOUNTER — Ambulatory Visit: Payer: BC Managed Care – PPO | Admitting: Family Medicine

## 2022-07-14 VITALS — BP 176/94 | HR 85 | Temp 98.7°F | Ht 65.0 in | Wt 148.4 lb

## 2022-07-14 DIAGNOSIS — R12 Heartburn: Secondary | ICD-10-CM

## 2022-07-14 DIAGNOSIS — I10 Essential (primary) hypertension: Secondary | ICD-10-CM

## 2022-07-14 DIAGNOSIS — T50905D Adverse effect of unspecified drugs, medicaments and biological substances, subsequent encounter: Secondary | ICD-10-CM | POA: Diagnosis not present

## 2022-07-14 DIAGNOSIS — E1165 Type 2 diabetes mellitus with hyperglycemia: Secondary | ICD-10-CM

## 2022-07-14 DIAGNOSIS — R1010 Upper abdominal pain, unspecified: Secondary | ICD-10-CM

## 2022-07-14 MED ORDER — TRIAMTERENE-HCTZ 37.5-25 MG PO TABS: 1.0000 | ORAL_TABLET | Freq: Every day | ORAL | 3 refills | Status: AC

## 2022-07-14 NOTE — Patient Instructions (Addendum)
Please schedule a visit with Dr. Cherlynn Kaiser in 2-3 weeks from now to recheck your blood pressure and potassium on the new blood pressure pill.   Stop the amlodipine. Continue your Losartan  I have added a diuretic to take in addition.    If you have any questions or concerns, please don't hesitate to send me a message via MyChart or call the office at [phone]. Thank you for visiting with Latoya Wells today! It's our pleasure caring for you.

## 2022-07-14 NOTE — Progress Notes (Signed)
Subjective  CC:  Chief Complaint  Patient presents with   Abdominal Pain    Pt stated that she has been having some abd pain since she has been taking the amlodipine. The pain has eased up some but it's still there.    Same day acute visit; PCP not available. New pt to me. Chart reviewed.   HPI: Latoya Wells is a 61 y.o. female who presents to the office today to address the problems listed above in the chief complaint. 61 year old female with type 2 diabetes and hypertension presents due to upper abdominal pain.  Here with her PCP several months ago with similar complaints.  Treated for presumed GERD/gastritis.  Omeprazole has helped her heartburn, however she has persistent upper abdominal burning pain.  She feels a burning type sensation but thinks it is more superficial, skin related.  She denies nausea vomiting abdominal bloating cramping or change in bowels.  The symptoms started with the onset of use of amlodipine.  She stopped amlodipine for 3 to 4 days in the symptoms resolved.  She started back yesterday and the symptoms resurface.  She has no rashes.  She takes losartan for her blood pressure.  She had been on hydrochlorothiazide by chart review but it is document that she had some hypokalemia with that. Hypertension: She reports that she was up all night due to the symptoms above and therefore her blood pressure is partly elevated because of her fatigue and pain response.  However blood pressures have been partially well controlled on losartan and amlodipine.  No chest pain.   Assessment  1. Pain of upper abdomen   2. Adverse effect of drug, subsequent encounter   3. Type 2 diabetes mellitus with hyperglycemia, without long-term current use of insulin ([place])   4. Primary hypertension   5. Heartburn      Plan  Upper abdominal pain, patient believes related to amlodipine: We will stop amlodipine and follow-up for her symptoms.  Continue omeprazole for GERD/heartburn.  She will  follow-up with her PCP if this change does not resolve her problem for further evaluation.  I did review recent lab work, normal LFTs, CBC. Hypertension, uncontrolled: Stopping amlodipine, continue losartan and add Maxide 37.5/25 daily.  Recommend recheck in 2 to 3 weeks for follow-up blood pressure and potassium recheck with PCP.  Follow up: 2 to 3 weeks with PCP 08/05/2022  No orders of the defined types were placed in this encounter.  Meds ordered this encounter  Medications   triamterene-hydrochlorothiazide (MAXZIDE-25) 37.5-25 MG tablet    Sig: Take 1 tablet by mouth daily.    Dispense:  90 tablet    Refill:  3      I reviewed the patients updated PMH, FH, and SocHx.    Patient Active Problem List   Diagnosis Date Noted   Anxiety 03/05/2022   Carotid artery stenosis 03/05/2022   Hyperglycemia due to type 2 diabetes mellitus ([place]) 03/05/2022   Personal history of transient ischemic attack (TIA), and cerebral infarction without residual deficits 03/05/2022   Tobacco dependence 03/05/2022   Type 2 diabetes mellitus with hyperglycemia, without long-term current use of insulin ([place]) 12/11/2021   Primary hypertension 12/11/2021   Pure hypercholesterolemia 12/11/2021   Leukocytosis 05/10/2015   Current Meds  Medication Sig   aspirin 81 MG tablet Take 81 mg by mouth daily.   glucose blood test strip Use to test your blood sugar   JARDIANCE 10 MG TABS tablet TAKE 1 TABLET BY MOUTH  EVERY DAY   losartan (COZAAR) 50 MG tablet TAKE 1 TABLET(50 MG) BY MOUTH TWICE DAILY AT 10 AM AND AT 5 PM   metFORMIN (GLUCOPHAGE-XR) 500 MG 24 hr tablet Take 2 tablets (1,000 mg total) by mouth 2 (two) times daily with a meal. Take 2 tablet by mouth every morning and 1 tablets every evening   omeprazole (PRILOSEC) 40 MG capsule Take 1 capsule (40 mg total) by mouth daily.   pravastatin (PRAVACHOL) 40 MG tablet Take 1 tablet (40 mg total) by mouth daily.   triamterene-hydrochlorothiazide (MAXZIDE-25)  37.5-25 MG tablet Take 1 tablet by mouth daily.    Allergies: Patient is allergic to lisinopril, hydromorphone, hydromorphone hcl, other, and semaglutide(0.25 or 0.'5mg'$ -dos). Family History: Patient family history includes Arthritis in her mother; Diabetes in her sister; Heart disease in her mother; Hypertension in her mother. Social History:  Patient  reports that she quit smoking about 4 years ago. Her smoking use included cigarettes. She has a 15.00 pack-year smoking history. She has never used smokeless tobacco. She reports that she does not drink alcohol and does not use drugs.  Review of Systems: Constitutional: Negative for fever malaise or anorexia Cardiovascular: negative for chest pain Respiratory: negative for SOB or persistent cough Gastrointestinal: negative for abdominal pain  Objective  Vitals: BP (!) 176/94   Pulse 85   Temp 98.7 F (37.1 C)   Ht '5\' 5"'$  (1.651 m)   Wt 148 lb 6.4 oz (67.3 kg)   SpO2 98%   BMI 24.70 kg/m  General: no acute distress , A&Ox3, appears tired, nontoxic HEENT: PEERL, conjunctiva normal, neck is supple Cardiovascular:  RRR without murmur or gallop.  Respiratory:  Good breath sounds bilaterally, CTAB with normal respiratory effort Gastrointestinal: soft, flat abdomen, normal active bowel sounds, no palpable masses, no hepatosplenomegaly, no appreciated hernias Skin:  Warm, no rashes  No visits with results within 1 Day(s) from this visit.  Latest known visit with results is:  Office Visit on 03/05/2022  Component Date Value Ref Range Status   WBC 03/05/2022 7.7  4.0 - 10.5 K/uL Final   RBC 03/05/2022 4.44  3.87 - 5.11 Mil/uL Final   Platelets 03/05/2022 299.0  150.0 - 400.0 K/uL Final   Hemoglobin 03/05/2022 14.2  12.0 - 15.0 g/dL Final   HCT 03/05/2022 42.1  36.0 - 46.0 % Final   MCV 03/05/2022 94.9  78.0 - 100.0 fl Final   MCHC 03/05/2022 33.7  30.0 - 36.0 g/dL Final   RDW 03/05/2022 12.9  11.5 - 15.5 % Final   Sodium 03/05/2022 139   135 - 145 mEq/L Final   Potassium 03/05/2022 4.6  3.5 - 5.1 mEq/L Final   Chloride 03/05/2022 103  96 - 112 mEq/L Final   CO2 03/05/2022 25  19 - 32 mEq/L Final   Glucose, Bld 03/05/2022 160 (H)  70 - 99 mg/dL Final   BUN 03/05/2022 17  6 - 23 mg/dL Final   Creatinine, Ser 03/05/2022 0.54  0.40 - 1.20 mg/dL Final   Total Bilirubin 03/05/2022 0.5  0.2 - 1.2 mg/dL Final   Alkaline Phosphatase 03/05/2022 90  39 - 117 U/L Final   AST 03/05/2022 12  0 - 37 U/L Final   ALT 03/05/2022 14  0 - 35 U/L Final   Total Protein 03/05/2022 7.7  6.0 - 8.3 g/dL Final   Albumin 03/05/2022 4.4  3.5 - 5.2 g/dL Final   GFR 03/05/2022 98.47  >60.00 mL/min Final   Calcium 03/05/2022  10.0  8.4 - 10.5 mg/dL Final   Hgb A1c MFr Bld 03/05/2022 8.4 (H)  4.6 - 6.5 % Final   Cholesterol 03/05/2022 164  0 - 200 mg/dL Final   Triglycerides 03/05/2022 78.0  0.0 - 149.0 mg/dL Final   HDL 03/05/2022 66.60  >39.00 mg/dL Final   VLDL 03/05/2022 15.6  0.0 - 40.0 mg/dL Final   LDL Cholesterol 03/05/2022 82  0 - 99 mg/dL Final   Total CHOL/HDL Ratio 03/05/2022 2   Final   NonHDL 03/05/2022 97.63   Final   TSH 03/05/2022 1.10  0.35 - 5.50 uIU/mL Final   Vitamin B-12 03/05/2022 227  211 - 911 pg/mL Final   Microalb, Ur 03/05/2022 1.1  0.0 - 1.9 mg/dL Final   Creatinine,U 03/05/2022 29.4  mg/dL Final   Microalb Creat Ratio 03/05/2022 3.6  0.0 - 30.0 mg/g Final   Hepatitis C Ab 03/05/2022 NON-REACTIVE  NON-REACTIVE Final   SIGNAL TO CUT-OFF 03/05/2022 0.18  <1.00 Final   HIV 1&2 Ab, 4th Generation 03/05/2022 NON-REACTIVE  NON-REACTIVE Final     Commons side effects, risks, benefits, and alternatives for medications and treatment plan prescribed today were discussed, and the patient expressed understanding of the given instructions. Patient is instructed to call or message via MyChart if he/she has any questions or concerns regarding our treatment plan. No barriers to understanding were identified. We discussed Red Flag  symptoms and signs in detail. Patient expressed understanding regarding what to do in case of urgent or emergency type symptoms.  Medication list was reconciled, printed and provided to the patient in AVS. Patient instructions and summary information was reviewed with the patient as documented in the AVS. This note was prepared with assistance of Dragon voice recognition software. Occasional wrong-word or sound-a-like substitutions may have occurred due to the inherent limitations of voice recognition software  This visit occurred during the SARS-CoV-2 public health emergency.  Safety protocols were in place, including screening questions prior to the visit, additional usage of staff PPE, and extensive cleaning of exam room while observing appropriate contact time as indicated for disinfecting solutions.

## 2022-07-15 ENCOUNTER — Emergency Department (HOSPITAL_COMMUNITY): Payer: BC Managed Care – PPO

## 2022-07-15 ENCOUNTER — Encounter (HOSPITAL_COMMUNITY): Payer: Self-pay

## 2022-07-15 ENCOUNTER — Emergency Department (HOSPITAL_COMMUNITY)
Admission: EM | Admit: 2022-07-15 | Discharge: 2022-07-16 | Disposition: A | Discharge status: Home / Self Care | Payer: BC Managed Care – PPO | Attending: Emergency Medicine | Admitting: Emergency Medicine

## 2022-07-15 ENCOUNTER — Other Ambulatory Visit: Payer: Self-pay

## 2022-07-15 DIAGNOSIS — M545 Low back pain, unspecified: Secondary | ICD-10-CM | POA: Diagnosis not present

## 2022-07-15 DIAGNOSIS — R42 Dizziness and giddiness: Secondary | ICD-10-CM | POA: Insufficient documentation

## 2022-07-15 DIAGNOSIS — Z7982 Long term (current) use of aspirin: Secondary | ICD-10-CM | POA: Insufficient documentation

## 2022-07-15 DIAGNOSIS — K7689 Other specified diseases of liver: Secondary | ICD-10-CM | POA: Insufficient documentation

## 2022-07-15 DIAGNOSIS — K8689 Other specified diseases of pancreas: Secondary | ICD-10-CM

## 2022-07-15 DIAGNOSIS — R1013 Epigastric pain: Secondary | ICD-10-CM | POA: Diagnosis present

## 2022-07-15 DIAGNOSIS — D72829 Elevated white blood cell count, unspecified: Secondary | ICD-10-CM | POA: Insufficient documentation

## 2022-07-15 DIAGNOSIS — K869 Disease of pancreas, unspecified: Secondary | ICD-10-CM | POA: Insufficient documentation

## 2022-07-15 DIAGNOSIS — R1010 Upper abdominal pain, unspecified: Secondary | ICD-10-CM

## 2022-07-15 LAB — CBC
HCT: 41.5 % (ref 36.0–46.0)
Hemoglobin: 14.3 g/dL (ref 12.0–15.0)
MCH: 32.7 pg (ref 26.0–34.0)
MCHC: 34.5 g/dL (ref 30.0–36.0)
MCV: 95 fL (ref 80.0–100.0)
Platelets: 213 10*3/uL (ref 150–400)
RBC: 4.37 MIL/uL (ref 3.87–5.11)
RDW: 12.1 % (ref 11.5–15.5)
WBC: 12.4 10*3/uL — ABNORMAL HIGH (ref 4.0–10.5)
nRBC: 0 % (ref 0.0–0.2)

## 2022-07-15 LAB — COMPREHENSIVE METABOLIC PANEL
ALT: 18 U/L (ref 0–44)
AST: 18 U/L (ref 15–41)
Albumin: 4.4 g/dL (ref 3.5–5.0)
Alkaline Phosphatase: 79 U/L (ref 38–126)
Anion gap: 12 (ref 5–15)
BUN: 20 mg/dL (ref 8–23)
CO2: 23 mmol/L (ref 22–32)
Calcium: 9.4 mg/dL (ref 8.9–10.3)
Chloride: 102 mmol/L (ref 98–111)
Creatinine, Ser: 0.73 mg/dL (ref 0.44–1.00)
GFR, Estimated: >60 mL/min (ref >60)
Glucose, Bld: 149 mg/dL — ABNORMAL HIGH (ref 70–99)
Potassium: 3.6 mmol/L (ref 3.5–5.1)
Sodium: 137 mmol/L (ref 135–145)
Total Bilirubin: 0.7 mg/dL (ref 0.3–1.2)
Total Protein: 8.2 g/dL — ABNORMAL HIGH (ref 6.5–8.1)

## 2022-07-15 LAB — LIPASE, BLOOD: Lipase: 41 U/L (ref 11–51)

## 2022-07-15 MED ORDER — IOHEXOL 300 MG/ML  SOLN
100.0000 mL | Freq: Once | INTRAMUSCULAR | Status: AC | PRN
  Administered 2022-07-15: 100 mL via INTRAVENOUS

## 2022-07-15 NOTE — ED Provider Notes (Signed)
[facility] DEPT Provider Note   CSN: [id number] Arrival date & time: 07/15/22  1659     History {Add pertinent medical, surgical, social history, OB history to HPI:1} Chief Complaint  Patient presents with   Abdominal Pain   Weakness   Back Pain    Latoya Wells is a 61 y.o. female.  He is here with multiple complaints.  She has had some ongoing low back pain for the last few months and just started seeing physical therapy.  For the last month or so she had some upper abdominal discomfort that started after she began a new blood pressure medication.  It seemed a little better this past weekend after she stopped the medicine but recurred when she restarted it.  She saw her primary care doctor yesterday who stopped that medication and added a new 1.  Today while she was at work she noticed feeling dizzy and lightheaded like she might pass out.  Reportedly her blood pressure was normal at that time.  She is also noticed some pain soreness and general weakness in her legs at times.  She has intermittent constipation and diarrhea.  No fevers or chills no chest pain or shortness of breath no urinary symptoms.  The history is provided by the patient.  Abdominal Pain Pain location:  Epigastric, RUQ and LUQ Pain quality: aching   Pain severity:  Moderate Onset quality:  Gradual Duration:  4 weeks Timing:  Intermittent Progression:  Unchanged Chronicity:  New Context: not trauma   Relieved by: maybe stopping BP med. Worsened by:  Nothing Ineffective treatments:  None tried Associated symptoms: constipation and diarrhea   Associated symptoms: no chest pain, no cough, no dysuria, no fever, no nausea, no shortness of breath and no vomiting   Weakness Severity:  Severe Onset quality:  Sudden Progression:  Improving Chronicity:  New Relieved by:  None tried Worsened by:  Activity Ineffective treatments:  None tried Associated symptoms: abdominal pain and  diarrhea   Associated symptoms: no chest pain, no cough, no dysuria, no fever, no nausea, no shortness of breath, no stroke symptoms and no vomiting   Back Pain Associated symptoms: abdominal pain and weakness   Associated symptoms: no chest pain, no dysuria and no fever        Home Medications Prior to Admission medications   Medication Sig Start Date End Date Taking? Authorizing Provider  aspirin 81 MG tablet Take 81 mg by mouth daily.    [provider]  diclofenac Sodium (VOLTAREN) 1 % GEL Apply 4 g topically 4 (four) times daily as needed. Patient not taking: Reported on 07/14/2022 06/05/22   Tawnya Crook, MD  glucose blood test strip Use to test your blood sugar    [provider]  JARDIANCE 10 MG TABS tablet TAKE 1 TABLET BY MOUTH EVERY DAY 05/12/22   Tawnya Crook, MD  losartan (COZAAR) 50 MG tablet TAKE 1 TABLET(50 MG) BY MOUTH TWICE DAILY AT 10 AM AND AT 5 PM 04/30/22   Tawnya Crook, MD  metFORMIN (GLUCOPHAGE-XR) 500 MG 24 hr tablet Take 2 tablets (1,000 mg total) by mouth 2 (two) times daily with a meal. Take 2 tablet by mouth every morning and 1 tablets every evening 06/05/22   Tawnya Crook, MD  omeprazole (PRILOSEC) 40 MG capsule Take 1 capsule (40 mg total) by mouth daily. 06/05/22   Tawnya Crook, MD  pravastatin (PRAVACHOL) 40 MG tablet Take 1 tablet (40 mg  total) by mouth daily. 04/18/22   Tawnya Crook, MD  triamterene-hydrochlorothiazide (MAXZIDE-25) 37.5-25 MG tablet Take 1 tablet by mouth daily. 07/14/22   Leamon Arnt, MD      Allergies    Lisinopril, Hydromorphone, Hydromorphone hcl, Other, and Semaglutide(0.25 or 0.'5mg'$ -dos)    Review of Systems   Review of Systems  Constitutional:  Negative for fever.  Eyes:  Negative for visual disturbance.  Respiratory:  Negative for cough and shortness of breath.   Cardiovascular:  Negative for chest pain.  Gastrointestinal:  Positive for abdominal pain, constipation and diarrhea. Negative  for nausea and vomiting.  Genitourinary:  Negative for dysuria.  Musculoskeletal:  Positive for back pain.  Skin:  Negative for rash.  Neurological:  Positive for weakness.    Physical Exam Updated Vital Signs BP 118/60 (BP Location: Left Arm)   Pulse 99   Temp 97.8 F (36.6 C) (Oral)   Resp 17   Ht '5\' 5"'$  (1.651 m)   Wt 66.7 kg   SpO2 96%   BMI 24.46 kg/m  Physical Exam Vitals and nursing note reviewed.  Constitutional:      General: She is not in acute distress.    Appearance: Normal appearance. She is well-developed.  HENT:     Head: Normocephalic and atraumatic.  Eyes:     Conjunctiva/sclera: Conjunctivae normal.  Cardiovascular:     Rate and Rhythm: Normal rate and regular rhythm.     Heart sounds: No murmur heard. Pulmonary:     Effort: Pulmonary effort is normal. No respiratory distress.     Breath sounds: Normal breath sounds.  Abdominal:     Palpations: Abdomen is soft. There is no mass.     Tenderness: There is no abdominal tenderness. There is no guarding or rebound.     Hernia: No hernia is present.  Musculoskeletal:        General: No deformity. Normal range of motion.     Cervical back: Neck supple.     Right lower leg: No edema.     Left lower leg: No edema.  Skin:    General: Skin is warm and dry.     Capillary Refill: Capillary refill takes less than 2 seconds.  Neurological:     General: No focal deficit present.     Mental Status: She is alert and oriented to person, place, and time.     Sensory: No sensory deficit.     Motor: No weakness.     ED Results / Procedures / Treatments   Labs (all labs ordered are listed, but only abnormal results are displayed) Labs Reviewed  CBC - Abnormal; Notable for the following components:      Result Value   WBC 12.4 (*)    All other components within normal limits  COMPREHENSIVE METABOLIC PANEL - Abnormal; Notable for the following components:   Glucose, Bld 149 (*)    Total Protein 8.2 (*)    All  other components within normal limits  LIPASE, BLOOD  URINALYSIS, ROUTINE W REFLEX MICROSCOPIC    EKG EKG Interpretation  Date/Time:  Tuesday July 15 2022 17:53:44 EDT Ventricular Rate:  95 PR Interval:  173 QRS Duration: 97 QT Interval:  368 QTC Calculation: 463 R Axis:   81 Text Interpretation: Sinus rhythm RAE, consider biatrial enlargement Borderline right axis deviation Borderline T wave abnormalities Confirmed by Aletta Edouard [phone]) on 07/15/2022 10:23:53 PM  Radiology CT Head Wo Contrast  Result Date: 07/15/2022 CLINICAL DATA:  Abdominal  pain and cramping, weakness EXAM: CT HEAD WITHOUT CONTRAST TECHNIQUE: Contiguous axial images were obtained from the base of the skull through the vertex without intravenous contrast. RADIATION DOSE REDUCTION: This exam was performed according to the departmental dose-optimization program which includes automated exposure control, adjustment of the mA and/or kV according to patient size and/or use of iterative reconstruction technique. COMPARISON:  10/07/2017 FINDINGS: Brain: Focal hypodensity right cerebellar hemisphere consistent with chronic lacunar infarct. No evidence of acute infarct or hemorrhage. The lateral ventricles and midline structures are stable. No acute extra-axial fluid collections. No mass effect. Vascular: No hyperdense vessel or unexpected calcification. Skull: Normal. Negative for fracture or focal lesion. Sinuses/Orbits: No acute finding. Other: None. IMPRESSION: 1. No acute intracranial process. Electronically Signed   By: Randa Ngo M.D.   On: 07/15/2022 19:43    Procedures Procedures  {Document cardiac monitor, telemetry assessment procedure when appropriate:1}  Medications Ordered in ED Medications - No data to display  ED Course/ Medical Decision Making/ A&P                           Medical Decision Making Amount and/or Complexity of Data Reviewed Radiology: ordered.   This patient complains of ***;  this involves an extensive number of treatment Options and is a complaint that carries with it a high risk of complications and morbidity. The differential includes ***  I ordered, reviewed and interpreted labs, which included *** I ordered medication *** and reviewed PMP when indicated. I ordered imaging studies which included *** and I independently    visualized and interpreted imaging which showed *** Additional history obtained from *** Previous records obtained and reviewed *** I consulted *** and discussed lab and imaging findings and discussed disposition.  Cardiac monitoring reviewed, *** Social determinants considered, *** Critical Interventions: ***  After the interventions stated above, I reevaluated the patient and found *** Admission and further testing considered, ***   {Document critical care time when appropriate:1} {Document review of labs and clinical decision tools ie heart score, Chads2Vasc2 etc:1}  {Document your independent review of radiology images, and any outside records:1} {Document your discussion with family members, caretakers, and with consultants:1} {Document social determinants of health affecting pt's care:1} {Document your decision making why or why not admission, treatments were needed:1} Final Clinical Impression(s) / ED Diagnoses Final diagnoses:  None    Rx / DC Orders ED Discharge Orders     None

## 2022-07-15 NOTE — Discharge Instructions (Signed)
Your CT Imaging findings today are concerning for probable pancreatic malignancy/cancer. We have placed a referral to the oncology clinic (cancer doctors) to help arrange follow up and further management. If you do not hear from the oncology office in the next few days please call to arrange follow up appointment.   There was also a non-specific cyst noted on your liver, please follow up with your pcp for outpatient ultrasound of the liver to better characterize this.   Please return to the emergency department for any worsening or worrisome symptoms.

## 2022-07-15 NOTE — ED Triage Notes (Signed)
Patient c/o mid abdominal pain x 1 month since she began taking Amlodipine. Patient c/o weakness today. Patient states she had near syncope  today.  Patient also c/o lower back pain since the beginning of August.

## 2022-07-15 NOTE — ED Provider Triage Note (Signed)
Emergency Medicine Provider Triage Evaluation Note  Latoya Wells , a 61 y.o. female  was evaluated in triage.  Pt complains of some intermittent abdominal pain, epigastric cramping, low back pain, and weakness.  Patient reports that she was concerned that her abdominal pain and cramping may have been related to amlodipine, but she stopped taking it, just began taking a different medication, she cannot recall what the new medication is but reports that starts with a T.  Patient endorses today she felt significantly weak especially in the lower extremities but also was having some weakness trying to open a can of soda.  She denies any numbness, tingling, vision changes, confusion, someone told her that her gait was wobbly.  She does endorse previous history of TIA.  She denies any chest pain, shortness of breath.  Review of Systems  Positive: Weakness, abdominal cramping Negative: Fever, chills, numbness, tingling, vision changes, facial droop  Physical Exam  BP (!) 137/54   Pulse 95   Temp 97.8 F (36.6 C) (Oral)   Resp 18   Ht '5\' 5"'$  (1.651 m)   Wt 66.7 kg   SpO2 100%   BMI 24.46 kg/m  Gen:   Awake, no distress   Resp:  Normal effort  MSK:   Moves extremities without difficulty  Other:  Some minimal ttp epigastric region, Cranial nerves II through XII grossly intact.  Intact finger-nose, intact heel-to-shin.  Romberg negative, gait normal.  Alert and oriented x3.  Moves all 4 limbs spontaneously, normal coordination.  No pronator drift.  Intact strength 5 out of 5 bilateral upper and lower extremities.   Medical Decision Making  Medically screening exam initiated at 5:32 PM.  Appropriate orders placed.  Latoya Wells was informed that the remainder of the evaluation will be completed by another provider, this initial triage assessment does not replace that evaluation, and the importance of remaining in the ED until their evaluation is complete.  Workup initiated   Latoya Wells, [place] 07/15/22 1735

## 2022-07-16 ENCOUNTER — Encounter: Payer: Self-pay | Admitting: *Deleted

## 2022-07-16 MED ORDER — ONDANSETRON HCL 4 MG PO TABS: 4.0000 mg | ORAL_TABLET | ORAL | 0 refills | Status: AC | PRN

## 2022-07-16 MED ORDER — ACETAMINOPHEN-CODEINE 300-30 MG PO TABS: 1.0000 | ORAL_TABLET | Freq: Four times a day (QID) | ORAL | 0 refills | Status: DC | PRN

## 2022-07-16 MED ORDER — LORAZEPAM 1 MG PO TABS: 1.0000 mg | ORAL_TABLET | Freq: Every day | ORAL | 0 refills | Status: DC | PRN

## 2022-07-16 NOTE — ED Provider Notes (Signed)
  Provider Note MRN:  [id number]  Arrival date & time: 07/16/22    ED Course and Medical Decision Making  Assumed care from [facility] at shift change.  See note from prior team for complete details, in brief:  61 yo female To ED with upset stomach that she feels is 2/2 antihypertensive Seen by PCP recently, meds adjusted Dizzy at work Upper abdominal discomfort Back pain x1 month Muscle aches CTAP pending  Plan per prior physician CTAP  CT has resulted, concerning for primary pancreatic malignancy unfortunately with possible metastases to the lungs.  Also nonspecific hepatic lesion.  There is encasement of the splenic vein proximally, discussed with vascular surgery Dr. Scot Dock who recommends outpatient follow-up. Also adivsed pt to f/u w/ pcp for non-emergent [place] of liver.   Her pain is well controlled.  She is HDS.  Discussed CT imaging findings with the patient and family at bedside.  Will have patient follow-up with oncology, ambulatory referral placed.  I also secure chat messaged Dr. Julien Nordmann regarding follow up. Discussed strict return precautions.   The patient improved significantly and was discharged in stable condition. Detailed discussions were had with the patient regarding current findings, and need for close f/u with PCP or on call doctor. The patient has been instructed to return immediately if the symptoms worsen in any way for re-evaluation. Patient verbalized understanding and is in agreement with current care plan. All questions answered prior to discharge.     Procedures  Final Clinical Impressions(s) / ED Diagnoses     ICD-10-CM   1. Pancreatic mass  K86.89     2. Pain of upper abdomen  R10.10     3. Dizziness  R42     4. Acute midline low back pain without sciatica  M54.50     5. Hepatic cyst  K76.89       ED Discharge Orders          Ordered    Ambulatory referral to Hematology / Oncology        07/16/22 0211    ondansetron (ZOFRAN) 4 MG tablet   Every 4 hours PRN        07/16/22 0240    acetaminophen-codeine (TYLENOL #3) 300-30 MG tablet  Every 6 hours PRN        07/16/22 0240    LORazepam (ATIVAN) 1 MG tablet  Daily PRN        07/16/22 0241              Discharge Instructions      Your CT Imaging findings today are concerning for probable pancreatic malignancy/cancer. We have placed a referral to the oncology clinic (cancer doctors) to help arrange follow up and further management. If you do not hear from the oncology office in the next few days please call to arrange follow up appointment.   There was also a non-specific cyst noted on your liver, please follow up with your pcp for outpatient ultrasound of the liver to better characterize this.   Please return to the emergency department for any worsening or worrisome symptoms.        Jeanell Sparrow, DO 07/16/22 [phone]

## 2022-07-16 NOTE — Progress Notes (Signed)
PATIENT NAVIGATOR PROGRESS NOTE  Name: Latoya Wells Date: 07/16/2022 MRN: [id number]  DOB: [birth year]   Reason for visit:  Introductory phone call  Comments:  Called and spoke with [name] and discussed New patient appt for Monday, 07/21/22 at 3:30. Directions to building and parking discussed as well as one support person allowed in appt.  Verbalized understanding    Time spent counseling/coordinating care: 45-60 minutes

## 2022-07-21 ENCOUNTER — Encounter: Payer: Self-pay | Admitting: *Deleted

## 2022-07-21 ENCOUNTER — Other Ambulatory Visit: Payer: Self-pay | Admitting: *Deleted

## 2022-07-21 ENCOUNTER — Inpatient Hospital Stay: Payer: BC Managed Care – PPO | Attending: Oncology | Admitting: Oncology

## 2022-07-21 VITALS — BP 119/80 | HR 100 | Temp 98.1°F | Resp 18 | Ht 65.0 in | Wt 146.6 lb

## 2022-07-21 DIAGNOSIS — Z87891 Personal history of nicotine dependence: Secondary | ICD-10-CM

## 2022-07-21 DIAGNOSIS — E119 Type 2 diabetes mellitus without complications: Secondary | ICD-10-CM | POA: Diagnosis not present

## 2022-07-21 DIAGNOSIS — I10 Essential (primary) hypertension: Secondary | ICD-10-CM | POA: Diagnosis not present

## 2022-07-21 DIAGNOSIS — Z8041 Family history of malignant neoplasm of ovary: Secondary | ICD-10-CM

## 2022-07-21 DIAGNOSIS — M79661 Pain in right lower leg: Secondary | ICD-10-CM

## 2022-07-21 DIAGNOSIS — Z800 Family history of malignant neoplasm of digestive organs: Secondary | ICD-10-CM

## 2022-07-21 DIAGNOSIS — K8689 Other specified diseases of pancreas: Secondary | ICD-10-CM

## 2022-07-21 DIAGNOSIS — R634 Abnormal weight loss: Secondary | ICD-10-CM

## 2022-07-21 DIAGNOSIS — Z803 Family history of malignant neoplasm of breast: Secondary | ICD-10-CM

## 2022-07-21 DIAGNOSIS — Z801 Family history of malignant neoplasm of trachea, bronchus and lung: Secondary | ICD-10-CM

## 2022-07-21 MED ORDER — LORAZEPAM 0.5 MG PO TABS: 0.5000 mg | ORAL_TABLET | Freq: Four times a day (QID) | ORAL | 0 refills | Status: AC | PRN

## 2022-07-21 MED ORDER — ACETAMINOPHEN-CODEINE 300-30 MG PO TABS: 1.0000 | ORAL_TABLET | Freq: Four times a day (QID) | ORAL | 0 refills | Status: DC | PRN

## 2022-07-21 NOTE — Progress Notes (Signed)
PATIENT NAVIGATOR PROGRESS NOTE  Name: Latoya Wells Date: 07/21/2022 MRN: [id number]  DOB: 07/25/1961   Reason for visit:  Initial visit with Dr Benay Spice  Comments:  Met with Latoya Wells and her sister and mother during initial visit with Dr Benay Spice Referral made to IR for US guided liver biopsy for diagnosis confirmation Referral made to imaging at [facility] for Doppler of right leg due to pain to R/O blood clot Referral made to Genetics Referral made to Social work and patient given cover sheet for FMLA and Disability paperwork Referral made to nutrition due to weight loss, describes good appetite with early satiety. Return to clinic on 11/9 for diagnosis discussion and treatment options Given contact information to call with any issues or questions    Time spent counseling/coordinating care: > 60 minutes

## 2022-07-21 NOTE — Progress Notes (Signed)
[facility] New Patient Consult   Requesting MD: Sid Falcon [place],  [place] [address]   Latoya Wells 61 y.o.  May 11, 1961    Reason for Consult: Pancreas mass   HPI: Ms. Galasso reports a history of back pain since July of this year.  She developed upper abdominal pain beginning in October.  She initially felt this was related to starting a new blood pressure medication.  She developed generalized weakness and increased pain prior to presenting to the emergency room on 07/15/2022. A CT abdomen/pelvis revealed a heterogenous low-attenuation mass in the body of the pancreas.  There is encasement and narrowing of the origin of the splenic vein.  Small calcified lung nodules are suspicious for metastatic disease.  There are 2 low-attenuation liver lesions.  There are enlarged periaortic, periportal, and aortocaval lymph nodes.  She was prescribed Tylenol with codeine for pain and lorazepam for insomnia/anxiety.  She reports the codeine controls the pain.  Lorazepam has helped her sleep.   Past Medical History:  Diagnosis Date   Allergy    Cataract    Diabetes mellitus without complication ([place])    Hyperlipidemia    Hypertension     .  G1 P1  Past Surgical History:  Procedure Laterality Date   CHOLECYSTECTOMY     TUBAL LIGATION      Medications: Reviewed  Allergies:  Allergies  Allergen Reactions   Lisinopril Rash and Anaphylaxis    Other reaction(s): rash, tingling tongue   Hydromorphone Rash    Other reaction(s): rash   Hydromorphone Hcl Rash and Other (See Comments)   Other Other (See Comments)    Had reaction to bladder antibiotics--generic medication--name unknown to patient Other reaction(s): Other Had reaction to bladder antibiotics--generic medication--name unknown to patient Had reaction to bladder antibiotics--generic medication--name unknown to patient Had reaction to bladder antibiotics--generic medication--name unknown  to patient    Semaglutide(0.25 Or 0.'5mg'$ -Dos)     Other reaction(s): tingling lips    Family history: Maternal aunts and cousins have a history of breast cancer.  A maternal half cousin had ovarian cancer.  Her paternal grandmother had colon cancer.  Her maternal uncle had lung cancer.  Social History:   She lives with her mother in [place].  She works as a Chiropodist.  She quit smoking cigarettes in 2019.  She has a remote history of heavy alcohol use and quit approximately 2010.  She has received a flu vaccine.  No transfusion history.  No risk factor for HIV or hepatitis.  ROS:   Positives include: 50 pound weight loss over the past year, upper abdomen and low to mid back pain.  Episodes of facial numbness on 07/18/2022 and 07/19/2022 lasting for minutes, discomfort at the right upper calf/popliteal area for the past few days  A complete ROS was otherwise negative.  Physical Exam:  Blood pressure 119/80, pulse 100, temperature 98.1 F (36.7 C), resp. rate 18, height '5\' 5"'$  (1.651 m), weight 146 lb 9.6 oz (66.5 kg), SpO2 98 %.  HEENT: Oropharynx without visible mass, neck without mass. Lungs: Clear bilaterally, distant breath sounds, no respiratory distress Cardiac: Regular rate and rhythm Abdomen: Mild tenderness in the right upper abdomen, no hepatosplenomegaly, no mass  Vascular: Slight enlargement of the right compared to the left calf, no erythema or palpable cord Lymph nodes: No cervical, supraclavicular, axillary, or inguinal nodes Neurologic: Alert and oriented, the motor exam appears intact in the upper and lower extremities  bilaterally.  The extraocular movements are intact.  Sensation is intact to light touch at the face.  The face is symmetric. Skin: No rash Musculoskeletal: No spine tenderness   LAB:  CBC  Lab Results  Component Value Date   WBC 12.4 (H) 07/15/2022   HGB 14.3 07/15/2022   HCT 41.5 07/15/2022   MCV 95.0 07/15/2022   PLT 213 07/15/2022    NEUTROABS 7.0 (H) 06/04/2015        CMP  Lab Results  Component Value Date   NA 137 07/15/2022   K 3.6 07/15/2022   CL 102 07/15/2022   CO2 23 07/15/2022   GLUCOSE 149 (H) 07/15/2022   BUN 20 07/15/2022   CREATININE 0.73 07/15/2022   CALCIUM 9.4 07/15/2022   PROT 8.2 (H) 07/15/2022   ALBUMIN 4.4 07/15/2022   AST 18 07/15/2022   ALT 18 07/15/2022   ALKPHOS 79 07/15/2022   BILITOT 0.7 07/15/2022   GFRNONAA >60 07/15/2022   GFRAA >90 09/06/2014     Imaging: CT images from 07/15/2022 reviewed with Ms. Yeakel and her family    Assessment/Plan:   Pancreas mass CT abdomen/pelvis ten 2423-5.3 x 3.2 x 3.5 cm low-attenuation mass in the body of the pancreas with encasement of the origin of the splenic vein, small calcified nodules at the lung bases, 2 hypodense liver lesions, enlarged periportal and retroperitoneal lymph nodes Pain secondary #1 3.   Weight loss 4.   Diabetes 5.   Hypertension 6.   Right calf/popliteal pain 7.   Family history of multiple cancers including breast, colon, lung, and ovarian cancer   Disposition:   Ms. Passarelli presented to the emergency room with abdominal/back pain.  A CT reveals a pancreas mass.  The clinical presentation and CT findings are consistent with a diagnosis of pancreas cancer.  She appears to have metastatic pancreas cancer.  I reviewed the CT images and discussed the probable diagnosis with Ms. Mincey and her family.  She will be referred for a diagnostic ultrasound guided biopsy of the liver.  She will return for further discussion and treatment planning after the biopsy.  I refilled her prescriptions for Tylenol/codeine and lorazepam.  She will be referred to the genetics counselor.  I will present her case at the GI tumor conference.  Patients with pancreas cancer are at high risk for developing venous thromboembolic disease.  She complains of pain at the right calf/popliteal fossa.  There is slight enlargement of the right  compared to the left lower leg.  She will be referred for a Doppler ultrasound.  Betsy Coder, MD  07/21/2022, 5:19 PM

## 2022-07-22 ENCOUNTER — Other Ambulatory Visit (HOSPITAL_COMMUNITY): Payer: Self-pay

## 2022-07-22 ENCOUNTER — Other Ambulatory Visit: Payer: Self-pay | Admitting: *Deleted

## 2022-07-22 ENCOUNTER — Telehealth: Payer: Self-pay

## 2022-07-22 ENCOUNTER — Telehealth: Payer: Self-pay | Admitting: *Deleted

## 2022-07-22 ENCOUNTER — Encounter: Payer: Self-pay | Admitting: *Deleted

## 2022-07-22 ENCOUNTER — Ambulatory Visit (HOSPITAL_BASED_OUTPATIENT_CLINIC_OR_DEPARTMENT_OTHER)
Admission: RE | Admit: 2022-07-22 | Discharge: 2022-07-22 | Disposition: A | Discharge status: Home / Self Care | Payer: BC Managed Care – PPO | Source: Ambulatory Visit | Attending: Oncology | Admitting: Oncology

## 2022-07-22 ENCOUNTER — Telehealth: Payer: Self-pay | Admitting: Licensed Clinical Social Worker

## 2022-07-22 DIAGNOSIS — K8689 Other specified diseases of pancreas: Secondary | ICD-10-CM

## 2022-07-22 MED ORDER — APIXABAN (ELIQUIS) VTE STARTER PACK (10MG AND 5MG)
ORAL_TABLET | ORAL | 0 refills | Status: AC
  Filled 2022-07-22: qty 74, 15d supply, fill #0

## 2022-07-22 NOTE — Telephone Encounter (Signed)
Scheduled appt per 10/30 referral. Spoke to pt, however she was unable to take appt information. She said she would call back to confirm appt. Will mail updated calendar as well.

## 2022-07-22 NOTE — Progress Notes (Signed)
Latoya Mckusick, DO  [facility] Maser Cc: P Ir Procedure Requests Good day.  Just spoke with Dr. Benay Spice.  He will be sending request for US guided liver mass biopsy.  Suspected pancreatic CA mets.  OK for US guided liver mass biopsy.  Latoya Wells

## 2022-07-22 NOTE — Telephone Encounter (Signed)
CSW attempted to contact patient per the request of Nurse Navigator regarding support, advance directives and disability.  Left vm with CSW contact information.

## 2022-07-22 NOTE — Telephone Encounter (Signed)
Left VM for patient and notified sister that venous US shows DVT LLE. Needs to start Eliquis 10 mg bid x 7 days, then 5 mg BID. Starter pack script sent to [facility] (have free coupon). Need to start today. Sister reports she will have the medication picked up to start this evening.  This RN called IR and was informed that Dr. Anselm Pancoast will be doing the liver biopsy and need to hold the Eliquis for 4 doses prior to biopsy and can resume pm of biopsy per radiologist. Informed Dr. Benay Spice in case he needs to discuss with Dr. Anselm Pancoast re: Lovenox bridge.

## 2022-07-23 ENCOUNTER — Telehealth: Payer: Self-pay | Admitting: *Deleted

## 2022-07-23 NOTE — Telephone Encounter (Signed)
Called Latoya Wells and confirmed she started Eliquis last night. Instructed her to prepare for biopsy take last dose of Eliquis on 11/4 pm and resume pm of biopsy (11/7). Continue to follow the aspirin hold as directed by radiology.

## 2022-07-28 ENCOUNTER — Other Ambulatory Visit: Payer: Self-pay | Admitting: Student

## 2022-07-28 ENCOUNTER — Inpatient Hospital Stay: Payer: BC Managed Care – PPO | Attending: Oncology

## 2022-07-28 DIAGNOSIS — Z8041 Family history of malignant neoplasm of ovary: Secondary | ICD-10-CM | POA: Insufficient documentation

## 2022-07-28 DIAGNOSIS — E119 Type 2 diabetes mellitus without complications: Secondary | ICD-10-CM | POA: Insufficient documentation

## 2022-07-28 DIAGNOSIS — Z800 Family history of malignant neoplasm of digestive organs: Secondary | ICD-10-CM | POA: Insufficient documentation

## 2022-07-28 DIAGNOSIS — C787 Secondary malignant neoplasm of liver and intrahepatic bile duct: Secondary | ICD-10-CM | POA: Insufficient documentation

## 2022-07-28 DIAGNOSIS — Z419 Encounter for procedure for purposes other than remedying health state, unspecified: Secondary | ICD-10-CM

## 2022-07-28 DIAGNOSIS — Z801 Family history of malignant neoplasm of trachea, bronchus and lung: Secondary | ICD-10-CM | POA: Insufficient documentation

## 2022-07-28 DIAGNOSIS — I10 Essential (primary) hypertension: Secondary | ICD-10-CM | POA: Insufficient documentation

## 2022-07-28 DIAGNOSIS — R634 Abnormal weight loss: Secondary | ICD-10-CM | POA: Insufficient documentation

## 2022-07-28 DIAGNOSIS — C251 Malignant neoplasm of body of pancreas: Secondary | ICD-10-CM | POA: Insufficient documentation

## 2022-07-28 DIAGNOSIS — M79661 Pain in right lower leg: Secondary | ICD-10-CM | POA: Insufficient documentation

## 2022-07-28 DIAGNOSIS — Z803 Family history of malignant neoplasm of breast: Secondary | ICD-10-CM | POA: Insufficient documentation

## 2022-07-28 NOTE — H&P (Signed)
Chief Complaint: Patient was seen in consultation today for pancreatic mass, liver lesions at the request of Sherrill,Gary B  Referring Physician(s): Ladene Artist  Supervising Physician: Richarda Overlie  Patient Status: [facility] - Out-pt  History of Present Illness: Latoya Wells is a 61 y.o. female who presented to ED c/o generalized weakness and increased abdominal pain 07/15/22. CT AP at that time revealed heterogenous low-attenuation mass in the body of the pancreas with encasement and narrowing of the origin of the splenic vein, small calcified lung nodules suspicious for metastatic disease and two low-attenuation liver lesions. Pt was referred to oncology for evaluation. She has been referred to IR for liver lesion biopsy at the request of Thornton Papas, MD. Procedure was approved by Dr. Gilmer Mor, IR.   Pt endorses abdominal pain, weight loss and back pain.  She denies fever, chills, CP or SOB.  She is NPO per order.   Past Medical History:  Diagnosis Date   Allergy    Cataract    Diabetes mellitus without complication (HCC)    Hyperlipidemia    Hypertension     Past Surgical History:  Procedure Laterality Date   CHOLECYSTECTOMY     TUBAL LIGATION      Allergies: Lisinopril, Hydromorphone, Hydromorphone hcl, Other, and Semaglutide(0.25 or 0.5mg -dos)  Medications: Prior to Admission medications   Medication Sig Start Date End Date Taking? Authorizing Provider  acetaminophen (TYLENOL) 500 MG tablet Take 1,000 mg by mouth every 6 (six) hours as needed for mild pain.    [provider]  acetaminophen-codeine (TYLENOL #3) 300-30 MG tablet Take 1-2 tablets by mouth every 6 (six) hours as needed for moderate pain. Do not drive while taking acetaminophen-codeine 07/21/22   Ladene Artist, MD  APIXABAN Everlene Balls) VTE STARTER PACK (10MG  AND 5MG ) Take as directed on package: start with two-5mg  tablets twice daily for 7 days. On day 8, switch to one-5mg  tablet twice  daily. 07/22/22   Ladene Artist, MD  aspirin 81 MG tablet Take 81 mg by mouth daily.    [provider]  glucose blood test strip Use to test your blood sugar    [provider]  JARDIANCE 10 MG TABS tablet TAKE 1 TABLET BY MOUTH EVERY DAY 05/12/22   Jeani Sow, MD  LORazepam (ATIVAN) 0.5 MG tablet Take 1 tablet (0.5 mg total) by mouth every 6 (six) hours as needed for anxiety. Do not drive while taking lorazepam 07/21/22   Ladene Artist, MD  losartan (COZAAR) 50 MG tablet TAKE 1 TABLET(50 MG) BY MOUTH TWICE DAILY AT 10 AM AND AT 5 PM Patient taking differently: Take 50 mg by mouth daily. 04/30/22   Jeani Sow, MD  metFORMIN (GLUCOPHAGE-XR) 500 MG 24 hr tablet Take 2 tablets (1,000 mg total) by mouth 2 (two) times daily with a meal. Take 2 tablet by mouth every morning and 1 tablets every evening 06/05/22   Jeani Sow, MD  omeprazole (PRILOSEC) 40 MG capsule Take 1 capsule (40 mg total) by mouth daily. 06/05/22   Jeani Sow, MD  ondansetron (ZOFRAN) 4 MG tablet Take 1 tablet (4 mg total) by mouth every 4 (four) hours as needed for nausea or vomiting. Patient not taking: Reported on 07/21/2022 07/16/22   Tanda Rockers A, DO  pravastatin (PRAVACHOL) 40 MG tablet Take 1 tablet (40 mg total) by mouth daily. 04/18/22   Jeani Sow, MD  triamterene-hydrochlorothiazide (MAXZIDE-25) 37.5-25 MG tablet Take 1 tablet by mouth  daily. 07/14/22   Willow Ora, MD     Family History  Problem Relation Age of Onset   Hypertension Mother    Heart disease Mother    Arthritis Mother    Diabetes Sister     Social History   Socioeconomic History   Marital status: Divorced    Spouse name: Not on file   Number of children: 1   Years of education: Not on file   Highest education level: Not on file  Occupational History   Not on file  Tobacco Use   Smoking status: Former    Packs/day: 1.00    Years: 15.00    Total pack years: 15.00    Types: Cigarettes     Quit date: 2019    Years since quitting: 4.8   Smokeless tobacco: Never  Vaping Use   Vaping Use: Former   Substances: Nicotine  Substance and Sexual Activity   Alcohol use: No   Drug use: Not Currently    Types: Marijuana   Sexual activity: Not Currently  Other Topics Concern   Not on file  Social History Narrative   1 son.   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[employer]   Social Determinants of Health   Financial Resource Strain: Low Risk  (07/28/2022)   Overall Financial Resource Strain (CARDIA)    Difficulty of Paying Living Expenses: Not hard at all  Food Insecurity: No Food Insecurity (07/28/2022)   Hunger Vital Sign    Worried About Running Out of Food in the Last Year: Never true    Ran Out of Food in the Last Year: Never true  Transportation Needs: No Transportation Needs (07/28/2022)   PRAPARE - Administrator, Civil Service (Medical): No    Lack of Transportation (Non-Medical): No  Physical Activity: Not on file  Stress: Not on file  Social Connections: Not on file    Review of Systems: A 12 point ROS discussed and pertinent positives are indicated in the HPI above.  All other systems are negative.  Review of Systems  Constitutional:  Positive for appetite change and unexpected weight change. Negative for chills and fever.  Respiratory:  Negative for cough and shortness of breath.   Cardiovascular:  Negative for chest pain and leg swelling.  Gastrointestinal:  Positive for abdominal pain. Negative for nausea and vomiting.  Musculoskeletal:  Positive for back pain.  Hematological:  Does not bruise/bleed easily.    Vital Signs: Ht 5\' 5"  (1.651 m)   Wt 145 lb 8.1 oz (66 kg)   BMI 24.21 kg/m  BP 103/75, HR 102, RR 18, 96% RA, 98.5   Physical Exam Vitals reviewed.  Constitutional:      Appearance: Normal appearance.  HENT:     Mouth/Throat:     Mouth: Mucous membranes are dry.     Pharynx: Oropharynx is clear.  Eyes:     Extraocular Movements: Extraocular  movements intact.     Pupils: Pupils are equal, round, and reactive to light.  Cardiovascular:     Rate and Rhythm: Regular rhythm. Tachycardia present.  Pulmonary:     Effort: Pulmonary effort is normal. No respiratory distress.     Breath sounds: Normal breath sounds.  Abdominal:     Tenderness: There is abdominal tenderness.  Musculoskeletal:     Right lower leg: No edema.     Left lower leg: No edema.  Neurological:     Mental Status: She is alert.     Imaging: US  Venous Img Lower Bilateral  Result Date: 07/22/2022 CLINICAL DATA:  Bilateral lower extremity pain. EXAM: BILATERAL LOWER EXTREMITY VENOUS DOPPLER ULTRASOUND TECHNIQUE: Gray-scale sonography with graded compression, as well as color Doppler and duplex ultrasound were performed to evaluate the lower extremity deep venous systems from the level of the common femoral vein and including the common femoral, femoral, profunda femoral, popliteal and calf veins including the posterior tibial, peroneal and gastrocnemius veins when visible. The superficial great saphenous vein was also interrogated. Spectral Doppler was utilized to evaluate flow at rest and with distal augmentation maneuvers in the common femoral, femoral and popliteal veins. COMPARISON:  None Available. FINDINGS: RIGHT LOWER EXTREMITY Common Femoral Vein: No evidence of thrombus. Normal compressibility, respiratory phasicity and response to augmentation. Saphenofemoral Junction: No evidence of thrombus. Normal compressibility and flow on color Doppler imaging. Profunda Femoral Vein: No evidence of thrombus. Normal compressibility and flow on color Doppler imaging. Femoral Vein: No evidence of thrombus. Normal compressibility, respiratory phasicity and response to augmentation. Popliteal Vein: No evidence of thrombus. Normal compressibility, respiratory phasicity and response to augmentation. Calf Veins: No evidence of thrombus. Normal compressibility and flow on color  Doppler imaging. Superficial Great Saphenous Vein: No evidence of thrombus. Normal compressibility. Venous Reflux:  None. Other Findings:  None. LEFT LOWER EXTREMITY Common Femoral Vein: No evidence of thrombus. Normal compressibility, respiratory phasicity and response to augmentation. Saphenofemoral Junction: No evidence of thrombus. Normal compressibility and flow on color Doppler imaging. Profunda Femoral Vein: No evidence of thrombus. Normal compressibility and flow on color Doppler imaging. Femoral Vein: No evidence of thrombus. Normal compressibility, respiratory phasicity and response to augmentation. Popliteal Vein: No evidence of thrombus. Normal compressibility, respiratory phasicity and response to augmentation. Calf Veins: Posterior tibial and peroneal veins noncompressible consistent with thrombus. Superficial Great Saphenous Vein: No evidence of thrombus. Normal compressibility. Venous Reflux:  None. Other Findings:  None. IMPRESSION: Probable deep venous thrombosis seen involving the left posterior tibial and peroneal veins. These results will be called to the ordering clinician or representative by the Radiologist Assistant, and communication documented in the PACS or zVision Dashboard. Electronically Signed   By: Lupita Raider M.D.   On: 07/22/2022 12:55   CT Abdomen Pelvis W Contrast  Result Date: 07/16/2022 CLINICAL DATA:  Mid abdominal pain with weakness and near syncope. EXAM: CT ABDOMEN AND PELVIS WITH CONTRAST TECHNIQUE: Multidetector CT imaging of the abdomen and pelvis was performed using the standard protocol following bolus administration of intravenous contrast. RADIATION DOSE REDUCTION: This exam was performed according to the departmental dose-optimization program which includes automated exposure control, adjustment of the mA and/or kV according to patient size and/or use of iterative reconstruction technique. CONTRAST:  OMNIPAQUE IOHEXOL 300 MG/ML  SOLN COMPARISON:   September 06, 2014 FINDINGS: Lower chest: Several small noncalcified lung nodules are seen scattered throughout both lung bases. The largest measures approximately 5 mm and is seen within the posterolateral aspect of the left lower lobe (axial CT image 23, CT series 4). Hepatobiliary: There is mild diffuse fatty infiltration of the liver parenchyma. A 16 mm focus of parenchymal low attenuation is seen within the posterior aspect of the left lobe of the liver. An additional round, 14 mm diameter focus of low attenuation (approximately 34.55 Hounsfield units) is seen within the inferolateral aspect of the right lobe. Status post cholecystectomy. No biliary dilatation. Pancreas: A 5.3 cm x 3.2 cm x 3.5 cm heterogeneous low-attenuation mass is seen within the body of the pancreas (axial CT  images 25 through 33, CT series 2). Encasement and subsequent marked severity narrowing of the origin of the splenic vein is noted (axial CT images 29 and 30, CT series 2). A mild amount of surrounding inflammatory fat stranding is seen. Mild prominence of the pancreatic duct is seen within the pancreatic tail. Spleen: Normal in size without focal abnormality. Adrenals/Urinary Tract: Adrenal glands are unremarkable. Kidneys are normal in size, without renal calculi or hydronephrosis. A 7 mm focus of parenchymal low attenuation is seen within the anterior aspect of the lower pole of the right kidney (axial CT image 46, CT series 2). The urinary bladder is contracted and subsequently limited in evaluation. Stomach/Bowel: Stomach is within normal limits. Appendix appears normal. No evidence of bowel wall thickening, distention, or inflammatory changes. Noninflamed diverticula are seen throughout the sigmoid colon. Vascular/Lymphatic: Aortic atherosclerosis. A 17 mm para-aortic lymph node is seen to the left of the origin of the superior mesenteric artery (axial CT images 26 through 28, CT series 2). 18 mm posterior periportal and 11 mm  aortocaval lymph nodes are also noted. Reproductive: Uterus and bilateral adnexa are unremarkable. Other: No abdominal wall hernia or abnormality. No abdominopelvic ascites. Musculoskeletal: No acute or significant osseous findings. IMPRESSION: 1. 5.3 cm x 3.2 cm x 3.5 cm heterogeneous low-attenuation mass within the body of the pancreas, consistent with a primary pancreatic neoplasm. Concurrent mild acute pancreatitis cannot be excluded. Correlation with pancreatic enzymes is recommended. 2. Encasement and subsequent marked severity narrowing of the origin of the splenic vein. Further evaluation with conventional venography is recommended. 3. Several small noncalcified lung nodules scattered throughout both lung bases, concerning for metastatic disease. 4. Findings which may represent hepatic cysts and/or hemangiomas. Correlation with nonemergent hepatic ultrasound is recommended. 5. Sigmoid diverticulosis. 6. Evidence of prior cholecystectomy. 7. Aortic atherosclerosis. Aortic Atherosclerosis (ICD10-I70.0). Electronically Signed   By: Aram Candela M.D.   On: 07/16/2022 00:13   CT Head Wo Contrast  Result Date: 07/15/2022 CLINICAL DATA:  Abdominal pain and cramping, weakness EXAM: CT HEAD WITHOUT CONTRAST TECHNIQUE: Contiguous axial images were obtained from the base of the skull through the vertex without intravenous contrast. RADIATION DOSE REDUCTION: This exam was performed according to the departmental dose-optimization program which includes automated exposure control, adjustment of the mA and/or kV according to patient size and/or use of iterative reconstruction technique. COMPARISON:  10/07/2017 FINDINGS: Brain: Focal hypodensity right cerebellar hemisphere consistent with chronic lacunar infarct. No evidence of acute infarct or hemorrhage. The lateral ventricles and midline structures are stable. No acute extra-axial fluid collections. No mass effect. Vascular: No hyperdense vessel or unexpected  calcification. Skull: Normal. Negative for fracture or focal lesion. Sinuses/Orbits: No acute finding. Other: None. IMPRESSION: 1. No acute intracranial process. Electronically Signed   By: Sharlet Salina M.D.   On: 07/15/2022 19:43    Labs:  CBC: Recent Labs    03/05/22 0900 07/15/22 1818 07/29/22 1114  WBC 7.7 12.4* 12.6*  HGB 14.2 14.3 14.0  HCT 42.1 41.5 41.5  PLT 299.0 213 178    COAGS: Recent Labs    07/29/22 1114  INR 1.2    BMP: Recent Labs    03/05/22 0900 07/15/22 1818  NA 139 137  K 4.6 3.6  CL 103 102  CO2 25 23  GLUCOSE 160* 149*  BUN 17 20  CALCIUM 10.0 9.4  CREATININE 0.54 0.73  GFRNONAA  --  >60    LIVER FUNCTION TESTS: Recent Labs    03/05/22 0900 07/15/22  1818  BILITOT 0.5 0.7  AST 12 18  ALT 14 18  ALKPHOS 90 79  PROT 7.7 8.2*  ALBUMIN 4.4 4.4    TUMOR MARKERS: No results for input(s): "AFPTM", "CEA", "CA199", "CHROMGRNA" in the last 8760 hours.  Assessment and Plan:  61 yo female with pancreatic mass and liver lesions presents to IR for liver lesion biopsy.   Pt is A&O, calm and pleasant  She is in no distress.  INR 1.2, WBC 12.6  Risks and benefits of liver lesion biopsy with moderate sedation was discussed with the patient and/or patient's family including, but not limited to bleeding, infection, damage to adjacent structures or low yield requiring additional tests.  All of the questions were answered and there is agreement to proceed.  Consent signed and in chart.  Thank you for this interesting consult.  I greatly enjoyed meeting KARYS MECKLEY and look forward to participating in their care.  A copy of this report was sent to the requesting provider on this date.  Electronically Signed: Shon Hough, NP 07/29/2022, 12:55 PM   I spent a total of 20 minutes in face to face in clinical consultation, greater than 50% of which was counseling/coordinating care for liver lesions.

## 2022-07-28 NOTE — Progress Notes (Signed)
[place] Work  Initial Assessment   Latoya Wells is a 61 y.o. year old female contacted by phone. Clinical Social Work was referred by nurse navigator for assessment of psychosocial needs.   SDOH (Social Determinants of Health) assessments performed: Yes SDOH Interventions    Flowsheet Row Clinical Support from 07/28/2022 in [facility] Office Visit from 12/11/2021 in [facility] Interventions Intervention Not Indicated --  Housing Interventions Intervention Not Indicated --  Transportation Interventions Intervention Not Indicated --  Utilities Interventions Intervention Not Indicated --  Depression Interventions/Treatment  -- GQQ7-6 Score <4 Follow-up Not Indicated  Financial Strain Interventions Intervention Not Indicated --       SDOH Screenings   Food Insecurity: No Food Insecurity (07/28/2022)  Housing: Low Risk  (07/28/2022)  Transportation Needs: No Transportation Needs (07/28/2022)  Utilities: Not At Risk (07/28/2022)  Depression (PHQ2-9): Low Risk  (07/14/2022)  Financial Resource Strain: Low Risk  (07/28/2022)  Tobacco Use: Medium Risk (07/15/2022)     Distress Screen completed: No    07/21/2022    4:12 PM  ONCBCN DISTRESS SCREENING  Screening Type Initial Screening  Distress experienced in past week (1-10) 7  Family Problem type Children  Emotional problem type Depression;Nervousness/Anxiety;Adjusting to illness;Feeling hopeless  Spiritual/Religous concerns type Relating to God  Information Concerns Type Lack of info about diagnosis;Lack of info about treatment;Lack of info about complementary therapy choices  Physical Problem type Pain;Sleep/insomnia;Skin dry/itchy  Physician notified of physical symptoms Yes  Referral to clinical psychology No  Referral to clinical social work Yes  Referral to dietition Yes  Referral to financial advocate No  Referral to  support programs No  Referral to palliative care No  Other Referral to chaplain      Family/Social Information:  Housing Arrangement: patient lives with her mother who is 60 y/o. Family members/support persons in your life? Family, Friends, and [employer].  Her son and sister live close to her. Transportation concerns: no  Employment: Out on work excuse Patient is currently using her FMLA.  She filled out her disability request from work today.  She is a Sports coach at [employer].  Income source: Supported by [employer] and [employer] Financial concerns: Yes, due to illness and/or loss of work during treatment Type of concern: Medical bills Food access concerns: no Religious or spiritual practice: Yes-Patient is a member of a church and receives support from its members. Services Currently in place:  [employer]  Coping/ Adjustment to diagnosis: Patient understands treatment plan and what happens next? yes Concerns about diagnosis and/or treatment: Losing my job and/or losing income, Overwhelmed by information, How I will pay for the services I need, and How will I care for myself Patient reported stressors: Finances, Depression, Anxiety/ nervousness, and Adjusting to my illness Hopes and/or priorities: Daci does not want to be a burden to her mother. Patient enjoys time with family/ friends Current coping skills/ strengths: Capable of independent living , Armed forces logistics/support/administrative officer , General fund of knowledge , Motivation for treatment/growth , Religious Affiliation , and Supportive family/friends     SUMMARY: Current SDOH Barriers:  Sallyanne has concerns about future financial stressors.  Clinical Social Work Clinical Goal(s):  Explore community resource options for unmet needs related to:  Financial Strain   Interventions: Discussed common feeling and emotions when being diagnosed with cancer, and the importance of support during treatment Informed patient of the support team  roles and support services at [facility] Provided [place] contact information and encouraged patient to call with any questions or concerns Provided patient with information about the [employer]  CSW to mail program information.   Follow Up Plan: CSW will follow-up with patient by phone  Patient verbalizes understanding of plan: Yes    Rodman Pickle Jessey Stehlin, LCSW

## 2022-07-29 ENCOUNTER — Ambulatory Visit (HOSPITAL_COMMUNITY)
Admission: RE | Admit: 2022-07-29 | Discharge: 2022-07-29 | Disposition: A | Discharge status: Home / Self Care | Payer: BC Managed Care – PPO | Source: Ambulatory Visit | Attending: Oncology | Admitting: Oncology

## 2022-07-29 ENCOUNTER — Encounter (HOSPITAL_COMMUNITY): Payer: Self-pay

## 2022-07-29 ENCOUNTER — Other Ambulatory Visit: Payer: Self-pay

## 2022-07-29 DIAGNOSIS — C787 Secondary malignant neoplasm of liver and intrahepatic bile duct: Secondary | ICD-10-CM | POA: Insufficient documentation

## 2022-07-29 DIAGNOSIS — K869 Disease of pancreas, unspecified: Secondary | ICD-10-CM | POA: Diagnosis present

## 2022-07-29 DIAGNOSIS — C259 Malignant neoplasm of pancreas, unspecified: Secondary | ICD-10-CM | POA: Diagnosis not present

## 2022-07-29 DIAGNOSIS — R918 Other nonspecific abnormal finding of lung field: Secondary | ICD-10-CM | POA: Diagnosis not present

## 2022-07-29 DIAGNOSIS — K8689 Other specified diseases of pancreas: Secondary | ICD-10-CM

## 2022-07-29 DIAGNOSIS — Z419 Encounter for procedure for purposes other than remedying health state, unspecified: Secondary | ICD-10-CM

## 2022-07-29 LAB — PROTIME-INR
INR: 1.2 (ref 0.8–1.2)
Prothrombin Time: 14.7 seconds (ref 11.4–15.2)

## 2022-07-29 LAB — GLUCOSE, CAPILLARY: Glucose-Capillary: 195 mg/dL — ABNORMAL HIGH (ref 70–99)

## 2022-07-29 LAB — CBC
HCT: 41.5 % (ref 36.0–46.0)
Hemoglobin: 14 g/dL (ref 12.0–15.0)
MCH: 31.4 pg (ref 26.0–34.0)
MCHC: 33.7 g/dL (ref 30.0–36.0)
MCV: 93 fL (ref 80.0–100.0)
Platelets: 178 10*3/uL (ref 150–400)
RBC: 4.46 MIL/uL (ref 3.87–5.11)
RDW: 11.9 % (ref 11.5–15.5)
WBC: 12.6 10*3/uL — ABNORMAL HIGH (ref 4.0–10.5)
nRBC: 0 % (ref 0.0–0.2)

## 2022-07-29 MED ORDER — SODIUM CHLORIDE 0.9 % IV SOLN: INTRAVENOUS | Status: DC

## 2022-07-29 MED ORDER — LIDOCAINE HCL 1 % IJ SOLN
INTRAMUSCULAR | Status: AC
  Administered 2022-07-29: 20 mL via SUBCUTANEOUS
  Filled 2022-07-29: qty 20

## 2022-07-29 MED ORDER — OXYCODONE-ACETAMINOPHEN 5-325 MG PO TABS: 1.0000 | ORAL_TABLET | ORAL | Status: DC | PRN

## 2022-07-29 MED ORDER — MIDAZOLAM HCL 2 MG/2ML IJ SOLN
INTRAMUSCULAR | Status: AC | PRN
  Administered 2022-07-29 (×2): 1 mg via INTRAVENOUS
  Administered 2022-07-29 (×2): .5 mg via INTRAVENOUS
  Administered 2022-07-29: 1 mg via INTRAVENOUS

## 2022-07-29 MED ORDER — FENTANYL CITRATE (PF) 100 MCG/2ML IJ SOLN
INTRAMUSCULAR | Status: AC
  Filled 2022-07-29: qty 4

## 2022-07-29 MED ORDER — FENTANYL CITRATE (PF) 100 MCG/2ML IJ SOLN
INTRAMUSCULAR | Status: AC | PRN
  Administered 2022-07-29 (×4): 50 ug via INTRAVENOUS

## 2022-07-29 MED ORDER — MIDAZOLAM HCL 2 MG/2ML IJ SOLN
INTRAMUSCULAR | Status: AC
  Filled 2022-07-29: qty 4

## 2022-07-29 MED ORDER — GELATIN ABSORBABLE 12-7 MM EX MISC
CUTANEOUS | Status: AC
  Filled 2022-07-29: qty 1

## 2022-07-29 NOTE — Discharge Instructions (Signed)
Please call Interventional Radiology clinic [phone] with any questions or concerns.  You may remove your dressing and shower tomorrow.  Liver Biopsy, Care After After a liver biopsy, it is common to have these things in the area where the biopsy was done. You may: Have pain. Feel sore. Have bruising. You may also feel tired for a few days. Follow these instructions at home: Medicines Take over-the-counter and prescription medicines only as told by your doctor. If you were prescribed an antibiotic medicine, take it as told by your doctor. Do not stop taking the antibiotic, even if you start to feel better. Do not take medicines that may thin your blood. These medicines include aspirin and ibuprofen. Take them only if your doctor tells you to. If told, take steps to prevent problems with pooping (constipation). You may need to: Drink enough fluid to keep your pee (urine) pale yellow. Take medicines. You will be told what medicines to take. Eat foods that are high in fiber. These include beans, whole grains, and fresh fruits and vegetables. Limit foods that are high in fat and sugar. These include fried or sweet foods. Ask your doctor if you should avoid driving or using machines while you are taking your medicine. Caring for your incision Follow instructions from your doctor about how to take care of your cut from surgery (incisions). Make sure you: Wash your hands with soap and water for at least 20 seconds before and after you change your bandage. If you cannot use soap and water, use hand sanitizer. Change your bandage. Leavestitches or skin glue in place for at least two weeks. Leave tape strips alone unless you are told to take them off. You may trim the edges of the tape strips if they curl up. Check your incision every day for signs of infection. Check for: Redness, swelling, or more pain. Fluid or blood. Warmth. Pus or a bad smell. Do not take baths, swim, or use a hot tub.  Ask your doctor about taking showers or sponge baths. Activity Rest at home for 1-2 days, or as told by your doctor. Get up to take short walks every 1 to 2 hours. Ask for help if you feel weak or unsteady. Do not lift anything that is heavier than 10 lb (4.5 kg), or the limit that you are told. Do not play contact sports for 2 weeks after the procedure. Return to your normal activities as told by your doctor. Ask what activities are safe for you. General instructions Do not drink alcohol in the first week after the procedure. Plan to have a responsible adult care for you for the time you are told after you leave the hospital or clinic. This is important. It is up to you to get the results of your procedure. Ask how to get your results when they are ready. Keep all follow-up visits.   Contact a doctor if: You have more bleeding in your incision. Your incision swells, or is red and more painful. You have fluid that comes from your incision. You develop a rash. You have fever or chills. Get help right away if: You have swelling, bloating, or pain in your belly (abdomen). You get dizzy or faint. You vomit or you feel like vomiting. You have trouble breathing or feel short of breath. You have chest pain. You have problems talking or seeing. You have trouble with your balance or moving your arms or legs. These symptoms may be an emergency. Get help right away. Call  your local emergency services (911 in the [place] Do not wait to see if the symptoms will go away. Do not drive yourself to the hospital. Summary After the procedure, it is common to have pain, soreness, bruising, and tiredness. Your doctor will tell you how to take care of yourself at home. Change your bandage, take your medicines, and limit your activities as told by your doctor. Call your doctor if you have symptoms of infection. Get help right away if your belly swells, your cut bleeds a lot, or you have trouble talking or  breathing. This information is not intended to replace advice given to you by your health care provider. Make sure you discuss any questions you have with your healthcare provider. Document Revised: 07/23/2020 Document Reviewed: 07/23/2020 Elsevier Patient Education  2022 [employer].    Moderate Conscious Sedation, Adult, Care After This sheet gives you information about how to care for yourself after your procedure. Your health care provider may also give you more specific instructions. If you have problems or questions, contact your health careprovider. What can I expect after the procedure? After the procedure, it is common to have: Sleepiness for several hours. Impaired judgment for several hours. Difficulty with balance. Vomiting if you eat too soon. Follow these instructions at home: For the time period you were told by your health care provider: Rest. Do not participate in activities where you could fall or become injured. Do not drive or use machinery. Do not drink alcohol. Do not take sleeping pills or medicines that cause drowsiness. Do not make important decisions or sign legal documents. Do not take care of children on your own. Eating and drinking  Follow the diet recommended by your health care provider. Drink enough fluid to keep your urine pale yellow. If you vomit: Drink water, juice, or soup when you can drink without vomiting. Make sure you have little or no nausea before eating solid foods.  General instructions Take over-the-counter and prescription medicines only as told by your health care provider. Have a responsible adult stay with you for the time you are told. It is important to have someone help care for you until you are awake and alert. Do not smoke. Keep all follow-up visits as told by your health care provider. This is important. Contact a health care provider if: You are still sleepy or having trouble with balance after 24 hours. You feel  light-headed. You keep feeling nauseous or you keep vomiting. You develop a rash. You have a fever. You have redness or swelling around the IV site. Get help right away if: You have trouble breathing. You have new-onset confusion at home. Summary After the procedure, it is common to feel sleepy, have impaired judgment, or feel nauseous if you eat too soon. Rest after you get home. Know the things you should not do after the procedure. Follow the diet recommended by your health care provider and drink enough fluid to keep your urine pale yellow. Get help right away if you have trouble breathing or new-onset confusion at home. This information is not intended to replace advice given to you by your health care provider. Make sure you discuss any questions you have with your healthcare provider. Document Revised: 01/06/2020 Document Reviewed: 08/04/2019 Elsevier Patient Education  2022 [employer].

## 2022-07-29 NOTE — Procedures (Signed)
Interventional Radiology Procedure:   Indications: Pancreatic mass with liver lesions  Procedure: US guided liver lesion biopsy  Findings: Core biopsies from right hepatic lesion near the capsule.    Complications: No immediate complications noted.     EBL: Minimal  Plan: Discharge to home in 3 hours   Alfonza Toft R. Anselm Pancoast, MD  Pager: [phone]

## 2022-07-30 ENCOUNTER — Other Ambulatory Visit: Payer: Self-pay

## 2022-07-30 NOTE — Progress Notes (Signed)
The proposed treatment discussed in conference is for discussion purpose only and is not a binding recommendation.  The patients have not been physically examined, or presented with their treatment options.  Therefore, final treatment plans cannot be decided.  

## 2022-07-31 ENCOUNTER — Inpatient Hospital Stay: Payer: BC Managed Care – PPO | Admitting: Oncology

## 2022-07-31 ENCOUNTER — Encounter: Payer: Self-pay | Admitting: *Deleted

## 2022-07-31 ENCOUNTER — Encounter: Payer: Self-pay | Admitting: Oncology

## 2022-07-31 VITALS — BP 132/77 | HR 96 | Temp 98.1°F | Resp 20 | Ht 65.0 in | Wt 143.4 lb

## 2022-07-31 DIAGNOSIS — E119 Type 2 diabetes mellitus without complications: Secondary | ICD-10-CM | POA: Diagnosis not present

## 2022-07-31 DIAGNOSIS — Z803 Family history of malignant neoplasm of breast: Secondary | ICD-10-CM | POA: Diagnosis not present

## 2022-07-31 DIAGNOSIS — Z800 Family history of malignant neoplasm of digestive organs: Secondary | ICD-10-CM | POA: Diagnosis not present

## 2022-07-31 DIAGNOSIS — C259 Malignant neoplasm of pancreas, unspecified: Secondary | ICD-10-CM | POA: Diagnosis not present

## 2022-07-31 DIAGNOSIS — I10 Essential (primary) hypertension: Secondary | ICD-10-CM | POA: Diagnosis not present

## 2022-07-31 DIAGNOSIS — C787 Secondary malignant neoplasm of liver and intrahepatic bile duct: Secondary | ICD-10-CM

## 2022-07-31 DIAGNOSIS — R634 Abnormal weight loss: Secondary | ICD-10-CM | POA: Diagnosis not present

## 2022-07-31 DIAGNOSIS — C251 Malignant neoplasm of body of pancreas: Secondary | ICD-10-CM

## 2022-07-31 DIAGNOSIS — Z801 Family history of malignant neoplasm of trachea, bronchus and lung: Secondary | ICD-10-CM | POA: Diagnosis not present

## 2022-07-31 DIAGNOSIS — M79661 Pain in right lower leg: Secondary | ICD-10-CM | POA: Diagnosis not present

## 2022-07-31 DIAGNOSIS — Z8041 Family history of malignant neoplasm of ovary: Secondary | ICD-10-CM | POA: Diagnosis not present

## 2022-07-31 LAB — SURGICAL PATHOLOGY

## 2022-07-31 MED ORDER — PROCHLORPERAZINE MALEATE 10 MG PO TABS: 10.0000 mg | ORAL_TABLET | Freq: Four times a day (QID) | ORAL | 3 refills | Status: AC | PRN

## 2022-07-31 MED ORDER — OXYCODONE HCL 5 MG PO TABS: 5.0000 mg | ORAL_TABLET | Freq: Four times a day (QID) | ORAL | 0 refills | Status: AC | PRN

## 2022-07-31 NOTE — Progress Notes (Signed)
[facility] OFFICE PROGRESS NOTE   Diagnosis: Pancreas cancer  INTERVAL HISTORY:   Ms. Bilyeu returns as scheduled.  She underwent biopsy of a right liver lesion 07/29/2022.  Pathology showed adenocarcinoma consistent with metastasis from primary pancreatic adenocarcinoma.  She continues to have back pain.  She is unable to take Tylenol #3 due to nausea.  She is taking plain Tylenol frequently.  Appetite remains poor.  She has lost about 3 more pounds.  She notes right leg weakness.  She denies bleeding.  Objective:  Vital signs in last 24 hours:  Blood pressure 132/77, pulse 96, temperature 98.1 F (36.7 C), temperature source Oral, resp. rate 20, height '5\' 5"'$  (1.651 m), weight 143 lb 6.4 oz (65 kg), SpO2 100 %.    GI: Abdomen soft and nontender.  No hepatosplenomegaly.  No mass. Vascular: No leg edema. Neuro: Alert and oriented.  Lower extremity motor strength 5/5. Skin: No rash.   Lab Results:  Lab Results  Component Value Date   WBC 12.6 (H) 07/29/2022   HGB 14.0 07/29/2022   HCT 41.5 07/29/2022   MCV 93.0 07/29/2022   PLT 178 07/29/2022   NEUTROABS 7.0 (H) 06/04/2015    Imaging:  No results found.  Medications: I have reviewed the patient's current medications.  Assessment/Plan: Pancreas mass CT abdomen/pelvis ten 2423-5.3 x 3.2 x 3.5 cm low-attenuation mass in the body of the pancreas with encasement of the origin of the splenic vein, small calcified nodules at the lung bases, 2 hypodense liver lesions, enlarged periportal and retroperitoneal lymph nodes Biopsy right liver lesion 07/29/2022-adenocarcinoma consistent with metastasis from primary pancreatic adenocarcinoma Pain secondary #1 3.   Weight loss 4.   Diabetes 5.   Hypertension 6.   Right calf/popliteal pain Doppler 07/22/2022-probable DVT in the left posterior tibial and peroneal veins 7.   Family history of multiple cancers including breast, colon, lung, and ovarian cancer     Disposition: Ms. Sheek has metastatic pancreas cancer.  We reviewed the diagnosis, prognosis and treatment options with her and her family at today's visit.  She understands that no therapy will be curative.  We discussed a supportive care approach versus systemic therapy.  She would like to proceed with a trial of systemic therapy.  We discussed NALIRIFOX and Gemcitabine/Abraxane.  She appears to be a candidate for NALIRIFOX.  We reviewed potential toxicities associated with chemotherapy including bone marrow toxicity, nausea, mouth sores, hair loss, allergic reaction.  We discussed the various forms of neuropathy associated with oxaliplatin.  We reviewed the diarrhea associated with 5-fluorouracil and irinotecan.  We reviewed the various skin toxicities associated with 5-fluorouracil including hand-foot syndrome, rash, skin hyperpigmentation, increased sensitivity to sun.  She agrees to proceed.  She will attend a chemotherapy education class.  We discussed the dexamethasone given as a premedication to the chemotherapy and the potential for increasing her blood sugar.  She understands to monitor her blood sugar closely for several days after each chemotherapy treatment.  She is unable to tolerate Tylenol 3 due to nausea.  We are sending a prescription to her pharmacy for oxycodone.  She will return for cycle 1 on 08/11/2022.  We will see her in follow-up prior to cycle 2 on 08/25/2022.  She will contact the office in the interim with any problems.  Patient seen with Dr. Benay Spice.   Ned Card ANP/GNP-BC   07/31/2022  3:21 PM  This was a shared visit with Ned Card.  Ms. Polcyn was interviewed and  examined.  We discussed the liver biopsy result with her.  She has been diagnosed with metastatic pancreas cancer.  No therapy would be curative.  We discussed supportive/comfort care versus a trial of systemic therapy.  We reviewed the gemcitabine Abraxane, FOLFIRINOX, and NALIRIFOX regimens.  I  feel it will be difficult for her to tolerate FOLFIRINOX.  I recommend NALIRIFOX.  We reviewed the potential toxicities associated with this chemotherapy regimen.  She will attend a chemotherapy teaching class.  She agrees to proceed.  She will be referred for Port-A-Cath placement.  We will obtain a baseline CA 19-9.  The plan is to complete 5-6 cycles of chemotherapy prior to a restaging evaluation.  She continues anticoagulation for treatment of the lower extremity DVT.  She complains of pain at the right trochanter.  We will observe this for now.  She will use Percocet as needed for pain.  A chemotherapy plan was entered today.  I was present for greater than 50% today's visit.  I performed medical decision making.  Julieanne Manson, MD

## 2022-07-31 NOTE — Progress Notes (Signed)
START ON PATHWAY REGIMEN - Pancreatic Adenocarcinoma     A cycle is every 14 days:     Irinotecan      Oxaliplatin      Leucovorin      Fluorouracil   **Always confirm dose/schedule in your pharmacy ordering system**  Patient Characteristics: Metastatic Disease, First Line, PS = 0,1, BRCA1/2 and PALB2  Mutation Absent/Unknown Therapeutic Status: Metastatic Disease Line of Therapy: First Line ECOG Performance Status: 1 BRCA1/2 Mutation Status: Awaiting Test Results PALB2 Mutation Status: Awaiting Test Results Intent of Therapy: Non-Curative / Palliative Intent, Discussed with Patient

## 2022-07-31 NOTE — Progress Notes (Signed)
PATIENT NAVIGATOR PROGRESS NOTE  Name: Latoya Wells Date: 07/31/2022 MRN: [id number]  DOB: [birth year]   Reason for visit:  F/U visit  Comments:  Met with [name] and her family Port a cath will be placed on 08/08/2022 at 2pm at [facility] education on 11/10 at 1000 Call with any questions    Time spent counseling/coordinating care: > 60 minutes

## 2022-08-01 ENCOUNTER — Inpatient Hospital Stay: Payer: BC Managed Care – PPO

## 2022-08-01 ENCOUNTER — Encounter: Payer: Self-pay | Admitting: *Deleted

## 2022-08-01 ENCOUNTER — Other Ambulatory Visit: Payer: Self-pay

## 2022-08-01 ENCOUNTER — Encounter: Payer: Self-pay | Admitting: Oncology

## 2022-08-01 DIAGNOSIS — C251 Malignant neoplasm of body of pancreas: Secondary | ICD-10-CM | POA: Diagnosis not present

## 2022-08-01 DIAGNOSIS — C259 Malignant neoplasm of pancreas, unspecified: Secondary | ICD-10-CM

## 2022-08-01 LAB — CMP (CANCER CENTER ONLY)
ALT: 18 U/L (ref 0–44)
AST: 15 U/L (ref 15–41)
Albumin: 4.7 g/dL (ref 3.5–5.0)
Alkaline Phosphatase: 71 U/L (ref 38–126)
Anion gap: 14 (ref 5–15)
BUN: 27 mg/dL — ABNORMAL HIGH (ref 8–23)
CO2: 23 mmol/L (ref 22–32)
Calcium: 10 mg/dL (ref 8.9–10.3)
Chloride: 99 mmol/L (ref 98–111)
Creatinine: 0.79 mg/dL (ref 0.44–1.00)
GFR, Estimated: >60 mL/min (ref >60)
Glucose, Bld: 215 mg/dL — ABNORMAL HIGH (ref 70–99)
Potassium: 4.2 mmol/L (ref 3.5–5.1)
Sodium: 136 mmol/L (ref 135–145)
Total Bilirubin: 0.6 mg/dL (ref 0.3–1.2)
Total Protein: 8.3 g/dL — ABNORMAL HIGH (ref 6.5–8.1)

## 2022-08-01 LAB — CBC WITH DIFFERENTIAL (CANCER CENTER ONLY)
Abs Immature Granulocytes: 0.03 10*3/uL (ref 0.00–0.07)
Basophils Absolute: 0.1 10*3/uL (ref 0.0–0.1)
Basophils Relative: 1 %
Eosinophils Absolute: 0.2 10*3/uL (ref 0.0–0.5)
Eosinophils Relative: 2 %
HCT: 39.7 % (ref 36.0–46.0)
Hemoglobin: 13.8 g/dL (ref 12.0–15.0)
Immature Granulocytes: 0 %
Lymphocytes Relative: 26 %
Lymphs Abs: 2.8 10*3/uL (ref 0.7–4.0)
MCH: 31.7 pg (ref 26.0–34.0)
MCHC: 34.8 g/dL (ref 30.0–36.0)
MCV: 91.3 fL (ref 80.0–100.0)
Monocytes Absolute: 0.8 10*3/uL (ref 0.1–1.0)
Monocytes Relative: 7 %
Neutro Abs: 6.9 10*3/uL (ref 1.7–7.7)
Neutrophils Relative %: 64 %
Platelet Count: 188 10*3/uL (ref 150–400)
RBC: 4.35 MIL/uL (ref 3.87–5.11)
RDW: 12 % (ref 11.5–15.5)
WBC Count: 10.7 10*3/uL — ABNORMAL HIGH (ref 4.0–10.5)
nRBC: 0 % (ref 0.0–0.2)

## 2022-08-01 NOTE — Progress Notes (Signed)
Foundation one testing ordered on accesssion number [id number]

## 2022-08-01 NOTE — Progress Notes (Signed)
Pharmacist Chemotherapy Monitoring - Initial Assessment    Anticipated start date: 08/11/22   The following has been reviewed per standard work regarding the patient's treatment regimen: The patient's diagnosis, treatment plan and drug doses, and organ/hematologic function Lab orders and baseline tests specific to treatment regimen  The treatment plan start date, drug sequencing, and pre-medications Prior authorization status  Patient's documented medication list, including drug-drug interaction screen and prescriptions for anti-emetics and supportive care specific to the treatment regimen The drug concentrations, fluid compatibility, administration routes, and timing of the medications to be used The patient's access for treatment and lifetime cumulative dose history, if applicable  The patient's medication allergies and previous infusion related reactions, if applicable   Changes made to treatment plan:  Switched Irinotecan --> Liposomal Irinotecan , changed LV base fluid to D5W & adj Oxaliplatin dose to 60 mg/m2 per NALIRIFOX regimen  Follow up needed:  Pending authorization for treatment    Latoya Wells, Pharm.D., CPP 08/01/2022'@12'$ :15 PM

## 2022-08-02 ENCOUNTER — Other Ambulatory Visit: Payer: Self-pay | Admitting: Physician Assistant

## 2022-08-02 LAB — CANCER ANTIGEN 19-9: CA 19-9: 13 U/mL (ref 0–35)

## 2022-08-03 ENCOUNTER — Other Ambulatory Visit: Payer: Self-pay

## 2022-08-04 ENCOUNTER — Telehealth: Payer: Self-pay | Admitting: Medical Oncology

## 2022-08-04 ENCOUNTER — Encounter: Payer: Self-pay | Admitting: *Deleted

## 2022-08-04 ENCOUNTER — Telehealth: Payer: Self-pay | Admitting: *Deleted

## 2022-08-04 NOTE — Telephone Encounter (Signed)
Call from provider at [facility] that patient was admitted to hospital there for stroke. Are concerned about continuing to hold blood thinners. May be able to get her port placed there. Dr. Benay Spice can call to discuss at [phone].

## 2022-08-04 NOTE — Telephone Encounter (Signed)
MTG-015 - Tissue and Bodily Fluids: Translational Medicine: Discovery and Evaluation of Biomarkers/Pharmacogenomics for the Diagnosis and Personalized Management of Patients    Outgoing call: 42  LVMOM with patient introducing myself and provided patient with a brief description of study. Per Dr. Benay Spice, patient may be interested in study and asked that I contact patient. Patient informed that study specimen needs to be collected prior to start of cancer treatment. Provided patient with my return contact information if she is interested. Patient thanked and encouraged to call with questions.   Maxwell Marion, RN, BSN, [place] [facility] Clinical Research Nurse Lead 08/04/2022 4:17 PM

## 2022-08-04 NOTE — Progress Notes (Signed)
PATIENT NAVIGATOR PROGRESS NOTE  Name: BLANCHE SCOVELL Date: 08/04/2022 MRN: [id number]  DOB: November 03, 1960   Reason for visit:  Telephone call  Comments:  Received phone call from sister regarding Ms Grimley. She experienced a stroke on Saturday and was admitted to [facility] in [place] and under went a thrombectomy Saturday night.  Stated that she is doing much better this am.  Will continue to update our team regarding d/c plan and resuming Eliquis    Time spent counseling/coordinating care: 45-60 minutes

## 2022-08-05 ENCOUNTER — Ambulatory Visit: Payer: BC Managed Care – PPO | Admitting: Family Medicine

## 2022-08-06 ENCOUNTER — Encounter: Payer: Self-pay | Admitting: *Deleted

## 2022-08-06 ENCOUNTER — Ambulatory Visit: Payer: BC Managed Care – PPO | Admitting: Family Medicine

## 2022-08-06 NOTE — Progress Notes (Signed)
PATIENT NAVIGATOR PROGRESS NOTE  Name: BRITTANYA WINBURN Date: 08/06/2022 MRN: [id number]  DOB: [birth year]   Reason for visit:  Telephone call with sister Diane  Comments:  Called and spoke with sister Lynda Rainwater and she informed that she is in [facility] unit at [facility] receiving end of life care. Ms Lafuente experienced 2nd stroke and decided for end of life care.  Family at bedside. Expressed condolences and offered active listening.  All appts here canceled per request     Time spent counseling/coordinating care: 45-60 minutes

## 2022-08-08 ENCOUNTER — Telehealth: Payer: Self-pay | Admitting: *Deleted

## 2022-08-08 ENCOUNTER — Ambulatory Visit (HOSPITAL_COMMUNITY): Admission: RE | Admit: 2022-08-08 | Payer: BC Managed Care – PPO | Source: Ambulatory Visit

## 2022-08-08 NOTE — Telephone Encounter (Signed)
Call from [facility] to report that [place] died on 27-Aug-2022 at 11:40 pm. MD notified.

## 2022-08-11 ENCOUNTER — Other Ambulatory Visit: Payer: Self-pay

## 2022-08-11 ENCOUNTER — Inpatient Hospital Stay: Payer: BC Managed Care – PPO

## 2022-08-13 ENCOUNTER — Inpatient Hospital Stay: Payer: BC Managed Care – PPO | Admitting: Nutrition

## 2022-08-13 ENCOUNTER — Inpatient Hospital Stay: Payer: BC Managed Care – PPO

## 2022-08-21 ENCOUNTER — Other Ambulatory Visit: Payer: Self-pay

## 2022-08-25 ENCOUNTER — Inpatient Hospital Stay: Payer: BC Managed Care – PPO

## 2022-08-25 ENCOUNTER — Inpatient Hospital Stay: Payer: BC Managed Care – PPO | Admitting: Nurse Practitioner

## 2022-08-25 ENCOUNTER — Other Ambulatory Visit: Payer: Self-pay | Admitting: Family Medicine

## 2022-08-25 NOTE — Telephone Encounter (Signed)
Left vm

## 2022-08-27 ENCOUNTER — Inpatient Hospital Stay: Payer: BC Managed Care – PPO

## 2022-08-28 ENCOUNTER — Encounter: Payer: Self-pay | Admitting: Oncology

## 2022-09-04 ENCOUNTER — Ambulatory Visit: Payer: Self-pay | Admitting: Family Medicine

## 2022-09-08 ENCOUNTER — Inpatient Hospital Stay: Payer: BC Managed Care – PPO | Admitting: Oncology

## 2022-09-08 ENCOUNTER — Inpatient Hospital Stay: Payer: BC Managed Care – PPO

## 2022-09-10 ENCOUNTER — Inpatient Hospital Stay: Payer: BC Managed Care – PPO

## 2022-09-29 ENCOUNTER — Other Ambulatory Visit: Payer: BC Managed Care – PPO

## 2022-09-29 ENCOUNTER — Encounter: Payer: BC Managed Care – PPO | Admitting: Licensed Clinical Social Worker

## 2022-10-24 ENCOUNTER — Other Ambulatory Visit: Payer: Self-pay | Admitting: Family Medicine

## 2023-06-26 ENCOUNTER — Other Ambulatory Visit: Payer: Self-pay | Admitting: Family Medicine

## 2023-06-26 DIAGNOSIS — Z1211 Encounter for screening for malignant neoplasm of colon: Secondary | ICD-10-CM

## 2023-06-26 DIAGNOSIS — Z1212 Encounter for screening for malignant neoplasm of rectum: Secondary | ICD-10-CM

## 2023-08-12 ENCOUNTER — Encounter: Payer: Self-pay | Admitting: Oncology

## 2023-08-12 NOTE — Telephone Encounter (Signed)
Telephone call
